# Patient Record
Sex: Female | Born: 1940
Health system: Southern US, Community
[De-identification: ages and names within clinical notes are randomized; demographics above are authoritative.]

---

## 1962-09-21 HISTORY — PX: TOTAL THYROIDECTOMY: SHX2547

## 2003-11-07 ENCOUNTER — Ambulatory Visit (HOSPITAL_COMMUNITY): Admission: RE | Admit: 2003-11-07 | Discharge: 2003-11-07 | Payer: Self-pay | Admitting: Orthopedic Surgery

## 2005-03-02 ENCOUNTER — Emergency Department (HOSPITAL_COMMUNITY): Admission: EM | Admit: 2005-03-02 | Discharge: 2005-03-02 | Payer: Self-pay | Admitting: Emergency Medicine

## 2006-05-26 ENCOUNTER — Encounter: Admission: RE | Admit: 2006-05-26 | Discharge: 2006-05-26 | Payer: Self-pay | Admitting: Gastroenterology

## 2010-04-16 ENCOUNTER — Inpatient Hospital Stay (HOSPITAL_COMMUNITY): Admission: EM | Admit: 2010-04-16 | Discharge: 2010-04-23 | Payer: Self-pay | Admitting: Emergency Medicine

## 2010-04-18 ENCOUNTER — Encounter (INDEPENDENT_AMBULATORY_CARE_PROVIDER_SITE_OTHER): Payer: Self-pay | Admitting: Cardiology

## 2010-07-03 ENCOUNTER — Encounter (HOSPITAL_COMMUNITY)
Admission: RE | Admit: 2010-07-03 | Discharge: 2010-09-08 | Payer: Self-pay | Source: Home / Self Care | Attending: Cardiovascular Disease | Admitting: Cardiovascular Disease

## 2010-08-13 ENCOUNTER — Emergency Department (HOSPITAL_COMMUNITY): Admission: EM | Admit: 2010-08-13 | Discharge: 2010-08-14 | Payer: Self-pay | Admitting: Emergency Medicine

## 2010-10-12 ENCOUNTER — Encounter: Payer: Self-pay | Admitting: Gastroenterology

## 2010-12-02 LAB — CBC
Platelets: 197 10*3/uL (ref 150–400)
RBC: 4.02 MIL/uL (ref 3.87–5.11)
RDW: 13.4 % (ref 11.5–15.5)
WBC: 7.9 10*3/uL (ref 4.0–10.5)

## 2010-12-02 LAB — PROTIME-INR
INR: 1.29 (ref 0.00–1.49)
Prothrombin Time: 16.3 seconds — ABNORMAL HIGH (ref 11.6–15.2)

## 2010-12-02 LAB — DIFFERENTIAL
Basophils Absolute: 0 10*3/uL (ref 0.0–0.1)
Lymphocytes Relative: 30 % (ref 12–46)
Neutro Abs: 4.6 10*3/uL (ref 1.7–7.7)

## 2010-12-02 LAB — APTT: aPTT: 29 seconds (ref 24–37)

## 2010-12-02 LAB — POCT I-STAT, CHEM 8
HCT: 40 % (ref 36.0–46.0)
Hemoglobin: 13.6 g/dL (ref 12.0–15.0)
Sodium: 141 mEq/L (ref 135–145)
TCO2: 25 mmol/L (ref 0–100)

## 2010-12-05 LAB — BASIC METABOLIC PANEL
CO2: 24 mEq/L (ref 19–32)
Calcium: 8.8 mg/dL (ref 8.4–10.5)
Calcium: 9 mg/dL (ref 8.4–10.5)
Creatinine, Ser: 0.94 mg/dL (ref 0.4–1.2)
GFR calc Af Amer: 56 mL/min — ABNORMAL LOW (ref >60)
GFR calc Af Amer: >60 mL/min (ref >60)
GFR calc non Af Amer: 47 mL/min — ABNORMAL LOW (ref >60)
Glucose, Bld: 109 mg/dL — ABNORMAL HIGH (ref 70–99)
Sodium: 140 mEq/L (ref 135–145)

## 2010-12-05 LAB — CBC
HCT: 40.1 % (ref 36.0–46.0)
Hemoglobin: 13.3 g/dL (ref 12.0–15.0)
Hemoglobin: 13.4 g/dL (ref 12.0–15.0)
MCH: 32.3 pg (ref 26.0–34.0)
MCH: 32.6 pg (ref 26.0–34.0)
MCHC: 33.2 g/dL (ref 30.0–36.0)
MCHC: 34.1 g/dL (ref 30.0–36.0)
MCHC: 34.1 g/dL (ref 30.0–36.0)
MCV: 97.3 fL (ref 78.0–100.0)
Platelets: 205 10*3/uL (ref 150–400)
RBC: 4 MIL/uL (ref 3.87–5.11)
RBC: 4.27 MIL/uL (ref 3.87–5.11)
RDW: 12.7 % (ref 11.5–15.5)

## 2010-12-05 LAB — PROTIME-INR: Prothrombin Time: 20.5 seconds — ABNORMAL HIGH (ref 11.6–15.2)

## 2010-12-05 LAB — HEPARIN LEVEL (UNFRACTIONATED)
Heparin Unfractionated: 0.24 IU/mL — ABNORMAL LOW (ref 0.30–0.70)
Heparin Unfractionated: 0.32 IU/mL (ref 0.30–0.70)

## 2010-12-05 LAB — BRAIN NATRIURETIC PEPTIDE: Pro B Natriuretic peptide (BNP): 981 pg/mL — ABNORMAL HIGH (ref 0.0–100.0)

## 2010-12-06 LAB — COMPREHENSIVE METABOLIC PANEL
ALT: 22 U/L (ref 0–35)
Alkaline Phosphatase: 56 U/L (ref 39–117)
BUN: 9 mg/dL (ref 6–23)
CO2: 27 mEq/L (ref 19–32)
Chloride: 102 mEq/L (ref 96–112)
Glucose, Bld: 93 mg/dL (ref 70–99)
Potassium: 3.9 mEq/L (ref 3.5–5.1)
Sodium: 138 mEq/L (ref 135–145)
Total Bilirubin: 0.5 mg/dL (ref 0.3–1.2)
Total Protein: 6.7 g/dL (ref 6.0–8.3)

## 2010-12-06 LAB — BASIC METABOLIC PANEL
BUN: 10 mg/dL (ref 6–23)
CO2: 23 mEq/L (ref 19–32)
Calcium: 8.4 mg/dL (ref 8.4–10.5)
Calcium: 8.8 mg/dL (ref 8.4–10.5)
Chloride: 104 mEq/L (ref 96–112)
Chloride: 110 mEq/L (ref 96–112)
Creatinine, Ser: 0.89 mg/dL (ref 0.4–1.2)
GFR calc Af Amer: >60 mL/min (ref >60)
GFR calc non Af Amer: 60 mL/min — ABNORMAL LOW (ref >60)
GFR calc non Af Amer: >60 mL/min (ref >60)
GFR calc non Af Amer: >60 mL/min (ref >60)
Glucose, Bld: 109 mg/dL — ABNORMAL HIGH (ref 70–99)
Glucose, Bld: 125 mg/dL — ABNORMAL HIGH (ref 70–99)
Potassium: 3.1 mEq/L — ABNORMAL LOW (ref 3.5–5.1)
Potassium: 3.4 mEq/L — ABNORMAL LOW (ref 3.5–5.1)
Potassium: 3.8 mEq/L (ref 3.5–5.1)
Sodium: 137 mEq/L (ref 135–145)
Sodium: 139 mEq/L (ref 135–145)
Sodium: 141 mEq/L (ref 135–145)

## 2010-12-06 LAB — POCT CARDIAC MARKERS
CKMB, poc: 36.3 ng/mL (ref 1.0–8.0)
CKMB, poc: 72.5 ng/mL (ref 1.0–8.0)
Myoglobin, poc: >500 ng/mL (ref 12–200)
Myoglobin, poc: >500 ng/mL (ref 12–200)
Troponin i, poc: 6.94 ng/mL (ref 0.00–0.09)

## 2010-12-06 LAB — CBC
HCT: 39.7 % (ref 36.0–46.0)
HCT: 39.7 % (ref 36.0–46.0)
Hemoglobin: 13.5 g/dL (ref 12.0–15.0)
Hemoglobin: 13.6 g/dL (ref 12.0–15.0)
MCH: 32.9 pg (ref 26.0–34.0)
MCHC: 34 g/dL (ref 30.0–36.0)
MCHC: 34.1 g/dL (ref 30.0–36.0)
MCHC: 34.3 g/dL (ref 30.0–36.0)
MCV: 99.5 fL (ref 78.0–100.0)
Platelets: 162 10*3/uL (ref 150–400)
Platelets: 174 10*3/uL (ref 150–400)
RBC: 4.03 MIL/uL (ref 3.87–5.11)
RBC: 4.13 MIL/uL (ref 3.87–5.11)
RDW: 12.9 % (ref 11.5–15.5)
RDW: 13 % (ref 11.5–15.5)
WBC: 8.1 10*3/uL (ref 4.0–10.5)
WBC: 8.9 10*3/uL (ref 4.0–10.5)
WBC: 9.9 10*3/uL (ref 4.0–10.5)

## 2010-12-06 LAB — HEPARIN LEVEL (UNFRACTIONATED)
Heparin Unfractionated: 0.18 IU/mL — ABNORMAL LOW (ref 0.30–0.70)
Heparin Unfractionated: 0.5 IU/mL (ref 0.30–0.70)
Heparin Unfractionated: 0.68 IU/mL (ref 0.30–0.70)

## 2010-12-06 LAB — CARDIAC PANEL(CRET KIN+CKTOT+MB+TROPI)
CK, MB: 105.9 ng/mL (ref 0.3–4.0)
CK, MB: 183.5 ng/mL (ref 0.3–4.0)
CK, MB: 39.8 ng/mL (ref 0.3–4.0)
CK, MB: 66.9 ng/mL (ref 0.3–4.0)
Relative Index: 15.3 — ABNORMAL HIGH (ref 0.0–2.5)
Relative Index: 16.2 — ABNORMAL HIGH (ref 0.0–2.5)
Relative Index: 18.9 — ABNORMAL HIGH (ref 0.0–2.5)
Troponin I: 12.27 ng/mL (ref 0.00–0.06)
Troponin I: 18.54 ng/mL (ref 0.00–0.06)
Troponin I: 29.82 ng/mL (ref 0.00–0.06)

## 2010-12-06 LAB — PROTIME-INR
INR: 1.05 (ref 0.00–1.49)
Prothrombin Time: 13.6 seconds (ref 11.6–15.2)

## 2010-12-06 LAB — BRAIN NATRIURETIC PEPTIDE
Pro B Natriuretic peptide (BNP): 1117 pg/mL — ABNORMAL HIGH (ref 0.0–100.0)
Pro B Natriuretic peptide (BNP): 117 pg/mL — ABNORMAL HIGH (ref 0.0–100.0)
Pro B Natriuretic peptide (BNP): 884 pg/mL — ABNORMAL HIGH (ref 0.0–100.0)

## 2010-12-06 LAB — DIFFERENTIAL
Basophils Relative: 1 % (ref 0–1)
Lymphocytes Relative: 26 % (ref 12–46)
Lymphs Abs: 1.8 10*3/uL (ref 0.7–4.0)
Monocytes Absolute: 0.7 10*3/uL (ref 0.1–1.0)
Monocytes Relative: 11 % (ref 3–12)
Neutro Abs: 4.3 10*3/uL (ref 1.7–7.7)
Neutrophils Relative %: 63 % (ref 43–77)

## 2010-12-06 LAB — LIPID PANEL
LDL Cholesterol: 151 mg/dL — ABNORMAL HIGH (ref 0–99)
Total CHOL/HDL Ratio: 5 RATIO
Triglycerides: 107 mg/dL (ref <150)
VLDL: 21 mg/dL (ref 0–40)

## 2010-12-06 LAB — HEMOGLOBIN A1C
Hgb A1c MFr Bld: 5.6 % (ref <5.7)
Mean Plasma Glucose: 114 mg/dL (ref <117)

## 2010-12-06 LAB — APTT: aPTT: 30 seconds (ref 24–37)

## 2010-12-06 LAB — MAGNESIUM: Magnesium: 2.1 mg/dL (ref 1.5–2.5)

## 2010-12-06 LAB — MRSA PCR SCREENING: MRSA by PCR: NEGATIVE

## 2010-12-18 ENCOUNTER — Encounter (INDEPENDENT_AMBULATORY_CARE_PROVIDER_SITE_OTHER): Payer: Medicare Other

## 2010-12-18 DIAGNOSIS — I059 Rheumatic mitral valve disease, unspecified: Secondary | ICD-10-CM

## 2010-12-18 NOTE — Consult Note (Signed)
[facility] PATIENT CONSULTATION  Chelsea Warren, MCNEW DOB:  03-31-1941                                        December 18, 2010 CHART #:  [id number]  REQUESTING PHYSICIAN:  Sandy Salaam, MD  REASON FOR CONSULTATION:  Mitral regurgitation and atrial fibrillation.  HISTORY OF PRESENT ILLNESS:  The patient is a 70 year old widowed white female from [place] with history of mitral regurgitation, atrial fibrillation, coronary artery disease, hypertension, hyperlipidemia, and obesity.  The patient was in her usual state of health until July 2011 when she suffered an acute non-ST-segment elevation myocardial infarction associated with new-onset persistent atrial fibrillation.  At the time, she was hospitalized at [facility].  She underwent cardiac catheterization by Dr. Daphene Jaeger, demonstrating single vessel coronary artery disease with 100% occlusion of the terminal distal portion of an obtuse marginal branch of the left circumflex coronary artery.  There was normal left ventricular function.  The area of high-grade stenosis and occlusion was so far distal in the vessel, revascularization was felt not to be indicated, and the patient was treated medically.  Echocardiogram performed at the time demonstrated mitral regurgitation, although initial report of echocardiogram did not comment upon this.  There was severe left atrial enlargement.  The patient was discharged from the hospital on amiodarone and Coumadin with intention for close followup and possible late DC cardioversion if she did not spontaneously convert to sinus rhythm on her own.  She was followed for a period of time through Dr. Landry Dyke [facility] in [place], but the patient continued to do poorly with symptoms of severe shortness of breath, fatigue and chest pain.  She continued to return to Dr. [facility] but was never able to get any symptomatic relief and she was told this was due to her  obesity.  Ultimately she chose to transfer her subsequent medical care to [facility] in [facility].  Since then she has been followed by Dr. Rudolpho Sevin.  She underwent nuclear stress test on October 14, 2010.  She was noted to have poor exercise capacity.  Baseline left ventricular ejection fraction was estimated 61%.  There was a fixed defect noted in the inferior lateral wall but there is no sign of any stress-induced ischemia.  She continued on medical therapy.  She was hospitalized at [facility] in February with nosebleed and exacerbation of symptoms of congestive heart failure associated with atrial fibrillation with poor heart rate control.  Followup echocardiogram performed at that time demonstrated moderate-to-severe mitral regurgitation with normal left ventricular systolic function.  The patient's medications were adjusted and she symptomatically improved.  She has been seen in follow up by Dr. Rudolpho Sevin since then and discussions have been entertained regarding the possibility of elective mitral valve repair and Maze procedure.  She is referred for surgical consultation with plans to proceed with transesophageal echocardiogram and cardiac catheterization in the future if the patient is interested in proceeding with further diagnostic evaluation to consider elective surgical therapy.  REVIEW OF SYSTEMS:  GENERAL:  The patient reports normal appetite.  She has in fact gained 10-15 pounds weight over the last year or so, which she attributes to decrease physical activity.  She is 5 feet 5 inches tall and weighs approximately 220 pounds. CARDIAC:  The patient has stable symptoms of exertional shortness of breath.  She denies resting shortness of breath and PND.  She has orthopnea.  She has had some episodes of chest tightness in the past but none recently.  She states that for the last month or so she has been doing better on medical therapy  then she had been for month prior to that, although she does get short of breath with strenuous activity. She has had palpitations.  She has never had syncopal episodes.  She has not been having lower extremity edema.  RESPIRATORY:  Negative.  The patient denies productive cough, hemoptysis, or wheezing. GASTROINTESTINAL:  Notable for symptoms of reflux that are usually easily controlled with medical therapy.  The patient has no difficulty swallowing.  She has occasional problems with constipation.  She denies hematochezia, hematemesis, or melena.  GENITOURINARY:  Negative. PERIPHERAL VASCULAR:  Negative. NEUROLOGIC:  Negative. MUSCULOSKELETAL:  Negative.  The patient has mild arthralgias in her left knee but otherwise she is remarkably free of any problems with arthritis or arthralgias. PSYCHIATRIC:  Notable for long history of anxiety and nervousness. HEENT:  Negative.  The patient does have partial lower plate dentures with a loose wire and she has not seen a dentist in more than 2 years. She denies any loose teeth or teeth which are painful. HEMATOLOGIC:  Negative.  The patient does have occasional episodes of epistaxis and she has been on Coumadin therapy but overall she has done fairly well without any significant complications.  PAST MEDICAL HISTORY: 1. Mitral regurgitation. 2. Persistent atrial fibrillation. 3. Congestive heart failure. 4. Coronary artery disease, status post acute non-ST-segment elevation     myocardial infarction in July 2011. 5. Hypertension. 6. Hyperlipidemia. 7. Obesity. 8. Hypothyroidism. 9. GE reflux disease. 10.Remote tobacco use.  PAST SURGICAL HISTORY: 1. Subtotal thyroidectomy for goiter. 2. Hysterectomy. 3. Tonsillectomy. 4. Left knee surgery.  FAMILY HISTORY:  Noncontributory.  SOCIAL HISTORY:  The patient is widowed and lives alone in [place]. She has five grown children and four grandchildren.  She is retired but remains active  physically and currently tends to numerous rental properties.  She drives an automobile and tends to all of her own personal needs independently.  The patient has a history of tobacco use in the past but quit smoking completely in the summer of 2011 after her heart attack.  Prior to that she only smoked occasionally.  The patient denies alcohol consumption.  CURRENT MEDICATIONS: 1. Coumadin 5 mg daily 3 times a week, 2.5 mg daily 4 times a week. 2. Levoxyl 137 mcg daily. 3. Micardis 40 mg daily. 4. Diltiazem extended release 120 mg twice daily. 5. Aspirin 81 mg daily. 6. Prevacid 15 mg daily as needed. 7. Xanax 0.5 mg 3 times daily as needed. 8. Crestor 20 mg daily.  DRUG ALLERGIES:  The patient believes that she may be allergic to something given to her at the dentist office and she is not sure what it was.  The patient also states that she has been having hives recently and she suspects that may be related to Crestor, but this is not certain and she is still taking Crestor.  PHYSICAL EXAMINATION:  GENERAL:  The patient is a well-appearing obese female who appears in her stated age in no acute distress.  VITAL SIGNS: Pulses 90 and irregular.  HEENT:  Unrevealing.  There is no palpable lymphadenopathy.  There are no carotid bruits.  LUNGS:  Auscultation of the chest reveals clear breath sounds which are symmetrical bilaterally. No wheezes, rales, or rhonchi  noted.  CARDIOVASCULAR:  Notable for distant heart sounds with irregular rhythm.  I cannot appreciate a murmur on exam.  ABDOMEN:  Obese but soft, nontender.  There are no palpable masses.  Bowel sounds are present.  EXTREMITIES:  Warm and well perfused.  There is no lower extremity edema.  Distal pulses are not palpable.  Femoral pulses were difficult to palpate due to the patient's body habitus.  RECTAL and GU:  Both deferred.  SKIN:  Clean, dry, healthy-appearing throughout.  DIAGNOSTIC TEST:  Cardiac catheterization  performed on April 17, 2010 at [facility], is reviewed.  This demonstrates codominant coronary circulation.  There is 50% ostial stenosis of the first obtuse marginal branch of the left circumflex coronary artery.  There is 100% distal occlusion of a large third obtuse marginal branch.  The location of the occlusion is extremely distal on this vessel towards the apex of the heart.  The left anterior descending coronary artery is completely free of significant coronary artery disease.  The right coronary artery and its branches are free of significant coronary artery disease.  No other significant flow-limiting lesions were noted.  Aortogram of the infrarenal abdominal aorta and iliac vessels is notable for the absence of any significant aortoiliac occlusive disease.  Left ventricular function appears normal.  There appeared to be mild mitral regurgitation at cath.  A 2-D echocardiogram performed on April 18, 2010 at [facility], is reviewed.  This demonstrates mild left ventricular dysfunction with inferior wall hypokinesis.  Ejection fraction is estimated 50-55%.  There is mild aortic insufficiency.  There is moderate (2+) mitral regurgitation.  There is severe left atrial enlargement.  The mitral regurgitation was not comment upon in the official report of this exam.  A 2-D echocardiogram performed on November 14, 2010 at [facility], is reviewed.  This demonstrates mild left ventricular dysfunction with ejection fraction estimated 50% and inferior wall hypokinesis.  There is moderate-to-severe mitral regurgitation.  The functional anatomy of the mitral valve is suggestive of type 1 disease with a broad central jet of regurgitation although the leaflets of the valve are not well visualized in this transthoracic study.  There is mild aortic insufficiency.  There is mild tricuspid regurgitation.  There is severe left atrial  enlargement.  There is mild- to-moderate left ventricular hypertrophy.  IMPRESSION:  Mitral regurgitation with persistent atrial fibrillation and presently stable symptoms of exertional shortness of breath.  The most recent 2-D echocardiogram is notable for substantially increased severity of mitral regurgitation in comparison with echocardiogram performed in July 2011.  This may be related to loading conditions and changes in hemodynamics, as I suspect that the functional anatomy of the mitral valve is consistent with likely purely functional regurgitation. Under the circumstances in a female patient with hypertension and atrial fibrillation associated with severe left atrial enlargement and pure mitral annular dilatation, I do think that she would benefit from elective mitral valve repair and Maze procedure.  Alternatives include proceeding with surgical intervention versus continued medical therapy.  PLAN:  I have discussed matters at length with the patient and her family here in the office today.  We discussed the role of continued medical therapy with close followup versus proceeding with further evaluation to consider elective surgical intervention.  We discussed a variety of surgical approaches and in particular, the patient specifically interested in the minithoracotomy approach for mitral valve repair and Maze procedure.  This would depend upon the absence of  significant coronary artery disease and need for surgical revascularization, but based upon the catheterization performed last July I suspect that this would be reasonable.  Repeat catheterization will still be necessary prior to making any final decisions. Transesophageal echocardiogram would also be helpful to reassess the functional anatomy and severity of mitral regurgitation, as the likelihood of repairing the valve is particularly important in making this decision.  All their questions have been addressed.  The  patient plans to contact Dr. Jerrell Myl[facility] to schedule transesophageal echocardiogram and left and right heart catheterization in the near future.  Once this has been completed, we can reevaluate and consider proceeding with surgery depending upon findings.  All their questions have been addressed.  Salvatore Decent. Cornelius Moras, M.D. Electronically Signed  CHO/MEDQ  D:  12/18/2010  T:  12/18/2010  Job:  [id number]  cc:   Lucianne Lei, MD Sandy Salaam, MD

## 2011-01-26 ENCOUNTER — Encounter (INDEPENDENT_AMBULATORY_CARE_PROVIDER_SITE_OTHER): Payer: Medicare Other | Admitting: Thoracic Surgery (Cardiothoracic Vascular Surgery)

## 2011-01-26 DIAGNOSIS — I4891 Unspecified atrial fibrillation: Secondary | ICD-10-CM

## 2011-01-26 DIAGNOSIS — I059 Rheumatic mitral valve disease, unspecified: Secondary | ICD-10-CM

## 2011-01-27 NOTE — Assessment & Plan Note (Signed)
OFFICE VISIT  Chelsea Warren, Chelsea Warren DOB:  Jan 23, 1941                                        Jan 26, 2011 CHART #:  [id number]  The patient returns to the office today for further followup of mitral regurgitation and atrial fibrillation.  She was originally seen in consultation on December 18, 2010, and a full consultation note was dictated at that time.  Since then she underwent transesophageal echocardiogram as well as left and right heart catheterization by Dr. Tollie Pizza at [facility].  Transesophageal echocardiogram confirmed the presence of moderate-to-severe mitral regurgitation.  Functional anatomy of the mitral valve is consistent with type 1 dysfunction due to annular dilatation with normal leaflet mobility and incomplete coaptation.  There is no sign of mitral valve prolapse.  There is no sign of significant functional restriction. There is decreased left ventricular function with ejection fraction estimated only 35%.  The left ventricle was moderately dilated.  There was mild tricuspid regurgitation and mild aortic insufficiency.  No other abnormalities were noted.  Left and right heart catheterization performed by Dr. Reuel Boom demonstrates the presence of normal coronary artery anatomy with no significant coronary artery disease.  Aortogram of the abdominal aorta with iliac vessels is also notable for the absence of any significant aortoiliac disease.  There is moderately decreased left ventricular function with ejection fraction estimated at 35%.  The right heart catheterization data were not provided.  I discussed matters at length with the patient and one of her daughters here in the office today.  She has decided to change her primary cardiologist to Dr. Reuel Boom and she plans to see him back within the next few weeks.  She is hopeful that changing her blood pressure medications will make her feel better.  She has significant  exertional shortness of breath, currently functional class III.  She remains in atrial fibrillation at this time.  Overall, I suspect that she would benefit from elective mitral valve repair and maze procedure.  Based upon her recent catheterization, I suspect that this could be performed using a minimally invasive approach to further expedite her recovery.  However, her recovery will certainly be limited somewhat due to her obesity and other medical problems as well as her limited physical mobility. Nonetheless, I think she is a reasonable candidate for surgery. Continued medical therapy is certainly a reasonable alternative.  I have cautioned her that she might do well with medical therapy, but on the other hand it is quite possible that left ventricular function will only further deteriorate further increasing the risks and in the future.  All of their questions have been addressed.  She does not wish to consider proceeding with surgery electively at this time but she will plan on talking this over further with Dr. Reuel Boom.  We will plan to see her back in 1 month's time after she has had a chance to discuss matters at length with Dr. Reuel Boom.  Salvatore Decent. Cornelius Moras, M.D. Electronically Signed  CHO/MEDQ  D:  01/26/2011  T:  01/27/2011  Job:  [id number]  cc:   Lanell Matar, DO Sandy Salaam, MD Lucianne Lei, MD

## 2011-02-06 NOTE — Op Note (Signed)
NAME:  Chelsea Warren, Chelsea Warren                         ACCOUNT NO.:  [id number]   MEDICAL RECORD NO.:  [id number]                   PATIENT TYPE:  AMB   LOCATION:  DAY                                  FACILITY:  [facility]   PHYSICIAN:  Georges Lynch. Gioffre, M.D.             DATE OF BIRTH:  01-13-1941   DATE OF PROCEDURE:  11/07/2003  DATE OF DISCHARGE:                                 OPERATIVE REPORT   PREOPERATIVE DIAGNOSES:  1. Complex tear of the anterior cruciate ligament, left knee.  2. Torn posterior horn lateral meniscus, left knee.  3. Tear of the posterior horn of the medial meniscus, left knee.   POSTOPERATIVE DIAGNOSES:  1. Complex tear of the anterior cruciate ligament, left knee.  2. Torn posterior horn lateral meniscus, left knee.  3. Tear of the posterior horn of the medial meniscus, left knee.   OPERATION:  1. Diagnostic arthroscopy, left knee.  2. Medial meniscectomy, left knee.  3. Lateral meniscectomy, left knee.  4. Partial debridement of the anterior cruciate ligament, left knee.   SURGEON:  Georges Lynch. Darrelyn Hillock, M.D.   ASSISTANT:  Nurse.   DESCRIPTION OF PROCEDURE:  Under local anesthesia with standby, routine  orthopedic prep and draping of the left knee was carried out.  A small  punctate incision made in the suprapatellar pouch.  Inflow cannula was  inserted and the knee was distended with saline.  Another small punctate  incision was made in the lateral joint space.  The arthroscope was entered  and a complete diagnostic arthroscopy was carried out.  Of note, she had a  large flap tear of the posterior medial horn of the lateral meniscus.  I  introduced a shaver suction device and did a partial lateral meniscectomy.  Her ACL showed a complex tear, practically a complete tear.  She had some  fibers still present.  I debrided the torn portion of the ACL.  I then went  over to the medial joint space.  She had a large meniscal tear, posterior  horn on the left.  I  introduced the shaver suction device and did a partial  medial meniscectomy.  I thoroughly irrigated out the knee.  I made sure  there were no gross loose bodies.  The MRI showed there was a small loose  body posterior, but that was not free in the joint.  I thoroughly irrigated  out the area.  There was no other pathology noted.  I removed all the fluid,  closed all the punctate incisions with 3-0 nylon suture.  I injected 20 mL  of 0.25% Marcaine and epinephrine into the knee joint.  Following this I  dressed the wound with a Neosporin bundle dressing.  The patient left the  operating room in satisfactory condition.  She was given 1 g of IV  Ancef preop.  During the end of the case she was given 30 mg of Toradol  IV.  She will be on postop are Bufferin one twice a day as an anticoagulant for  two weeks.  She will be on [facility] for pain and crutches,  weightbearing as tolerated.                                               Ronald A. Darrelyn Hillock, M.D.    RAG/MEDQ  D:  11/07/2003  T:  11/07/2003  Job:  [id number]

## 2011-03-09 ENCOUNTER — Ambulatory Visit (INDEPENDENT_AMBULATORY_CARE_PROVIDER_SITE_OTHER): Payer: Medicare Other | Admitting: Thoracic Surgery (Cardiothoracic Vascular Surgery)

## 2011-03-09 DIAGNOSIS — I059 Rheumatic mitral valve disease, unspecified: Secondary | ICD-10-CM

## 2011-03-09 DIAGNOSIS — I4891 Unspecified atrial fibrillation: Secondary | ICD-10-CM

## 2011-03-10 NOTE — Assessment & Plan Note (Signed)
OFFICE VISIT  Chelsea Warren, Chelsea Warren DOB:  [birth year]                                        March 09, 2011 CHART #:  [id number]  HISTORY OF PRESENT ILLNESS:  The patient returns for followup of mitral regurgitation and atrial fibrillation.  She was last seen here in the office on Jan 26, 2011.  Since then, she has been seen in followup by Dr. Tollie Pizza and she underwent DC cardioversion 3 weeks ago.  Following this, she was hospitalized for several days for congestive heart failure, but ultimately she did better.  She says that since then she has been feeling much better and her breathing has significantly improved.  She has been taken off of amiodarone and she is now taking sotalol.  She seems to be maintaining sinus rhythm.  She states that she is breathing better than she has in more than a year.  The remainder of her review of systems is unremarkable.  The remainder of her past medical history is unchanged.  PHYSICAL EXAMINATION:  General:  Notable for a well-appearing female with blood pressure 110/80, pulse 60 regular, oxygen saturation 97% on room air.  Chest:  Auscultation of the chest demonstrates clear breath sounds that are symmetrical bilaterally.  No wheezes, rales or rhonchi are noted.  Cardiovascular:  Notable for regular rate and rhythm.  I do not appreciate a murmur on physical exam.  Extremities:  Warm and well perfused.  There is no lower extremity edema.  IMPRESSION:  The patient is clinically doing better, following recent direct current cardioversion and she appears to be maintaining sinus rhythm, so far on sotalol.  PLAN:  I spent in excess of 30 minutes discussing matters with the patient and her family.  I think it remains quite reasonable to continue another attempt with medical therapy.  If she again develops recurrent persistent atrial fibrillation, I suspect that she will not tolerate it well, and at that point we could consider  elective mitral valve repair and maze procedure.  However, as long as she does well on her current medical regimen, there is no reason to push further with surgery at this time.  We will plan to see her back in one year's time to see how she is getting along.  Salvatore Decent. Cornelius Moras, M.D. Electronically Signed  CHO/MEDQ  D:  03/09/2011  T:  03/10/2011  Job:  [id number]  cc:   Lanell Matar, DO Lucianne Lei, MD

## 2011-09-24 DIAGNOSIS — Z7901 Long term (current) use of anticoagulants: Secondary | ICD-10-CM | POA: Diagnosis not present

## 2011-09-29 DIAGNOSIS — R791 Abnormal coagulation profile: Secondary | ICD-10-CM | POA: Diagnosis not present

## 2011-09-29 DIAGNOSIS — I10 Essential (primary) hypertension: Secondary | ICD-10-CM | POA: Diagnosis not present

## 2011-09-29 DIAGNOSIS — I4891 Unspecified atrial fibrillation: Secondary | ICD-10-CM | POA: Diagnosis not present

## 2011-09-29 DIAGNOSIS — E039 Hypothyroidism, unspecified: Secondary | ICD-10-CM | POA: Diagnosis not present

## 2011-09-29 DIAGNOSIS — Z79899 Other long term (current) drug therapy: Secondary | ICD-10-CM | POA: Diagnosis not present

## 2011-09-29 DIAGNOSIS — E782 Mixed hyperlipidemia: Secondary | ICD-10-CM | POA: Diagnosis not present

## 2011-10-06 DIAGNOSIS — R791 Abnormal coagulation profile: Secondary | ICD-10-CM | POA: Diagnosis not present

## 2011-10-15 DIAGNOSIS — I4891 Unspecified atrial fibrillation: Secondary | ICD-10-CM | POA: Diagnosis not present

## 2011-10-15 DIAGNOSIS — E785 Hyperlipidemia, unspecified: Secondary | ICD-10-CM | POA: Diagnosis not present

## 2011-10-15 DIAGNOSIS — I2510 Atherosclerotic heart disease of native coronary artery without angina pectoris: Secondary | ICD-10-CM | POA: Diagnosis not present

## 2011-10-15 DIAGNOSIS — I5022 Chronic systolic (congestive) heart failure: Secondary | ICD-10-CM | POA: Diagnosis not present

## 2011-10-20 DIAGNOSIS — R791 Abnormal coagulation profile: Secondary | ICD-10-CM | POA: Diagnosis not present

## 2011-10-23 DIAGNOSIS — E039 Hypothyroidism, unspecified: Secondary | ICD-10-CM | POA: Diagnosis not present

## 2011-10-27 DIAGNOSIS — E039 Hypothyroidism, unspecified: Secondary | ICD-10-CM | POA: Diagnosis not present

## 2011-10-27 DIAGNOSIS — E7800 Pure hypercholesterolemia, unspecified: Secondary | ICD-10-CM | POA: Diagnosis not present

## 2011-11-04 DIAGNOSIS — E039 Hypothyroidism, unspecified: Secondary | ICD-10-CM | POA: Diagnosis not present

## 2011-11-04 DIAGNOSIS — I10 Essential (primary) hypertension: Secondary | ICD-10-CM | POA: Diagnosis not present

## 2011-11-04 DIAGNOSIS — Z79899 Other long term (current) drug therapy: Secondary | ICD-10-CM | POA: Diagnosis not present

## 2011-11-04 DIAGNOSIS — K219 Gastro-esophageal reflux disease without esophagitis: Secondary | ICD-10-CM | POA: Diagnosis not present

## 2011-11-04 DIAGNOSIS — E782 Mixed hyperlipidemia: Secondary | ICD-10-CM | POA: Diagnosis not present

## 2011-11-04 DIAGNOSIS — Z7901 Long term (current) use of anticoagulants: Secondary | ICD-10-CM | POA: Diagnosis not present

## 2011-11-18 DIAGNOSIS — R791 Abnormal coagulation profile: Secondary | ICD-10-CM | POA: Diagnosis not present

## 2011-11-26 DIAGNOSIS — H25019 Cortical age-related cataract, unspecified eye: Secondary | ICD-10-CM | POA: Diagnosis not present

## 2011-11-26 DIAGNOSIS — H2510 Age-related nuclear cataract, unspecified eye: Secondary | ICD-10-CM | POA: Diagnosis not present

## 2011-12-02 DIAGNOSIS — I4891 Unspecified atrial fibrillation: Secondary | ICD-10-CM | POA: Diagnosis not present

## 2011-12-02 DIAGNOSIS — E785 Hyperlipidemia, unspecified: Secondary | ICD-10-CM | POA: Diagnosis not present

## 2011-12-02 DIAGNOSIS — I10 Essential (primary) hypertension: Secondary | ICD-10-CM | POA: Diagnosis not present

## 2011-12-02 DIAGNOSIS — Z7901 Long term (current) use of anticoagulants: Secondary | ICD-10-CM | POA: Diagnosis not present

## 2011-12-02 DIAGNOSIS — I2510 Atherosclerotic heart disease of native coronary artery without angina pectoris: Secondary | ICD-10-CM | POA: Diagnosis not present

## 2011-12-22 DIAGNOSIS — I2510 Atherosclerotic heart disease of native coronary artery without angina pectoris: Secondary | ICD-10-CM | POA: Diagnosis not present

## 2011-12-22 DIAGNOSIS — I10 Essential (primary) hypertension: Secondary | ICD-10-CM | POA: Diagnosis not present

## 2011-12-22 DIAGNOSIS — I4891 Unspecified atrial fibrillation: Secondary | ICD-10-CM | POA: Diagnosis not present

## 2011-12-22 DIAGNOSIS — I5022 Chronic systolic (congestive) heart failure: Secondary | ICD-10-CM | POA: Diagnosis not present

## 2011-12-22 DIAGNOSIS — Z79899 Other long term (current) drug therapy: Secondary | ICD-10-CM | POA: Diagnosis not present

## 2011-12-22 DIAGNOSIS — I059 Rheumatic mitral valve disease, unspecified: Secondary | ICD-10-CM | POA: Diagnosis not present

## 2011-12-22 DIAGNOSIS — R079 Chest pain, unspecified: Secondary | ICD-10-CM | POA: Diagnosis not present

## 2011-12-22 DIAGNOSIS — R0602 Shortness of breath: Secondary | ICD-10-CM | POA: Diagnosis not present

## 2011-12-22 DIAGNOSIS — I252 Old myocardial infarction: Secondary | ICD-10-CM | POA: Diagnosis not present

## 2011-12-22 DIAGNOSIS — I429 Cardiomyopathy, unspecified: Secondary | ICD-10-CM | POA: Diagnosis not present

## 2011-12-22 DIAGNOSIS — I739 Peripheral vascular disease, unspecified: Secondary | ICD-10-CM | POA: Diagnosis not present

## 2011-12-22 DIAGNOSIS — Z7901 Long term (current) use of anticoagulants: Secondary | ICD-10-CM | POA: Diagnosis not present

## 2011-12-22 DIAGNOSIS — E785 Hyperlipidemia, unspecified: Secondary | ICD-10-CM | POA: Diagnosis not present

## 2011-12-30 DIAGNOSIS — Z7901 Long term (current) use of anticoagulants: Secondary | ICD-10-CM | POA: Diagnosis not present

## 2011-12-31 DIAGNOSIS — I059 Rheumatic mitral valve disease, unspecified: Secondary | ICD-10-CM | POA: Diagnosis not present

## 2012-01-04 DIAGNOSIS — E785 Hyperlipidemia, unspecified: Secondary | ICD-10-CM | POA: Diagnosis not present

## 2012-01-04 DIAGNOSIS — K219 Gastro-esophageal reflux disease without esophagitis: Secondary | ICD-10-CM | POA: Diagnosis not present

## 2012-01-04 DIAGNOSIS — I4891 Unspecified atrial fibrillation: Secondary | ICD-10-CM | POA: Diagnosis not present

## 2012-01-04 DIAGNOSIS — I2510 Atherosclerotic heart disease of native coronary artery without angina pectoris: Secondary | ICD-10-CM | POA: Diagnosis not present

## 2012-01-04 DIAGNOSIS — E039 Hypothyroidism, unspecified: Secondary | ICD-10-CM | POA: Diagnosis not present

## 2012-01-04 DIAGNOSIS — I10 Essential (primary) hypertension: Secondary | ICD-10-CM | POA: Diagnosis not present

## 2012-01-04 DIAGNOSIS — M159 Polyosteoarthritis, unspecified: Secondary | ICD-10-CM | POA: Diagnosis not present

## 2012-01-19 DIAGNOSIS — I4891 Unspecified atrial fibrillation: Secondary | ICD-10-CM | POA: Diagnosis not present

## 2012-01-19 DIAGNOSIS — H68009 Unspecified Eustachian salpingitis, unspecified ear: Secondary | ICD-10-CM | POA: Diagnosis not present

## 2012-01-19 DIAGNOSIS — J040 Acute laryngitis: Secondary | ICD-10-CM | POA: Diagnosis not present

## 2012-01-19 DIAGNOSIS — J309 Allergic rhinitis, unspecified: Secondary | ICD-10-CM | POA: Diagnosis not present

## 2012-01-19 DIAGNOSIS — R791 Abnormal coagulation profile: Secondary | ICD-10-CM | POA: Diagnosis not present

## 2012-02-18 DIAGNOSIS — E039 Hypothyroidism, unspecified: Secondary | ICD-10-CM | POA: Diagnosis not present

## 2012-02-18 DIAGNOSIS — K219 Gastro-esophageal reflux disease without esophagitis: Secondary | ICD-10-CM | POA: Diagnosis not present

## 2012-02-18 DIAGNOSIS — I4891 Unspecified atrial fibrillation: Secondary | ICD-10-CM | POA: Diagnosis not present

## 2012-02-18 DIAGNOSIS — Z7901 Long term (current) use of anticoagulants: Secondary | ICD-10-CM | POA: Diagnosis not present

## 2012-02-18 DIAGNOSIS — E782 Mixed hyperlipidemia: Secondary | ICD-10-CM | POA: Diagnosis not present

## 2012-02-25 DIAGNOSIS — N390 Urinary tract infection, site not specified: Secondary | ICD-10-CM | POA: Diagnosis not present

## 2012-02-25 DIAGNOSIS — R791 Abnormal coagulation profile: Secondary | ICD-10-CM | POA: Diagnosis not present

## 2012-03-07 ENCOUNTER — Encounter: Payer: Medicare Other | Admitting: Thoracic Surgery (Cardiothoracic Vascular Surgery)

## 2012-03-12 IMAGING — CR DG CHEST 2V
2 series · 2 of 2 positions shown · non-contrast
Comparison: Chest x-ray of 11/05/2003

CLINICAL DATA: Chest pain, history of arrhythmia, hypertension

CHEST - 2 VIEW

[w chest pa]
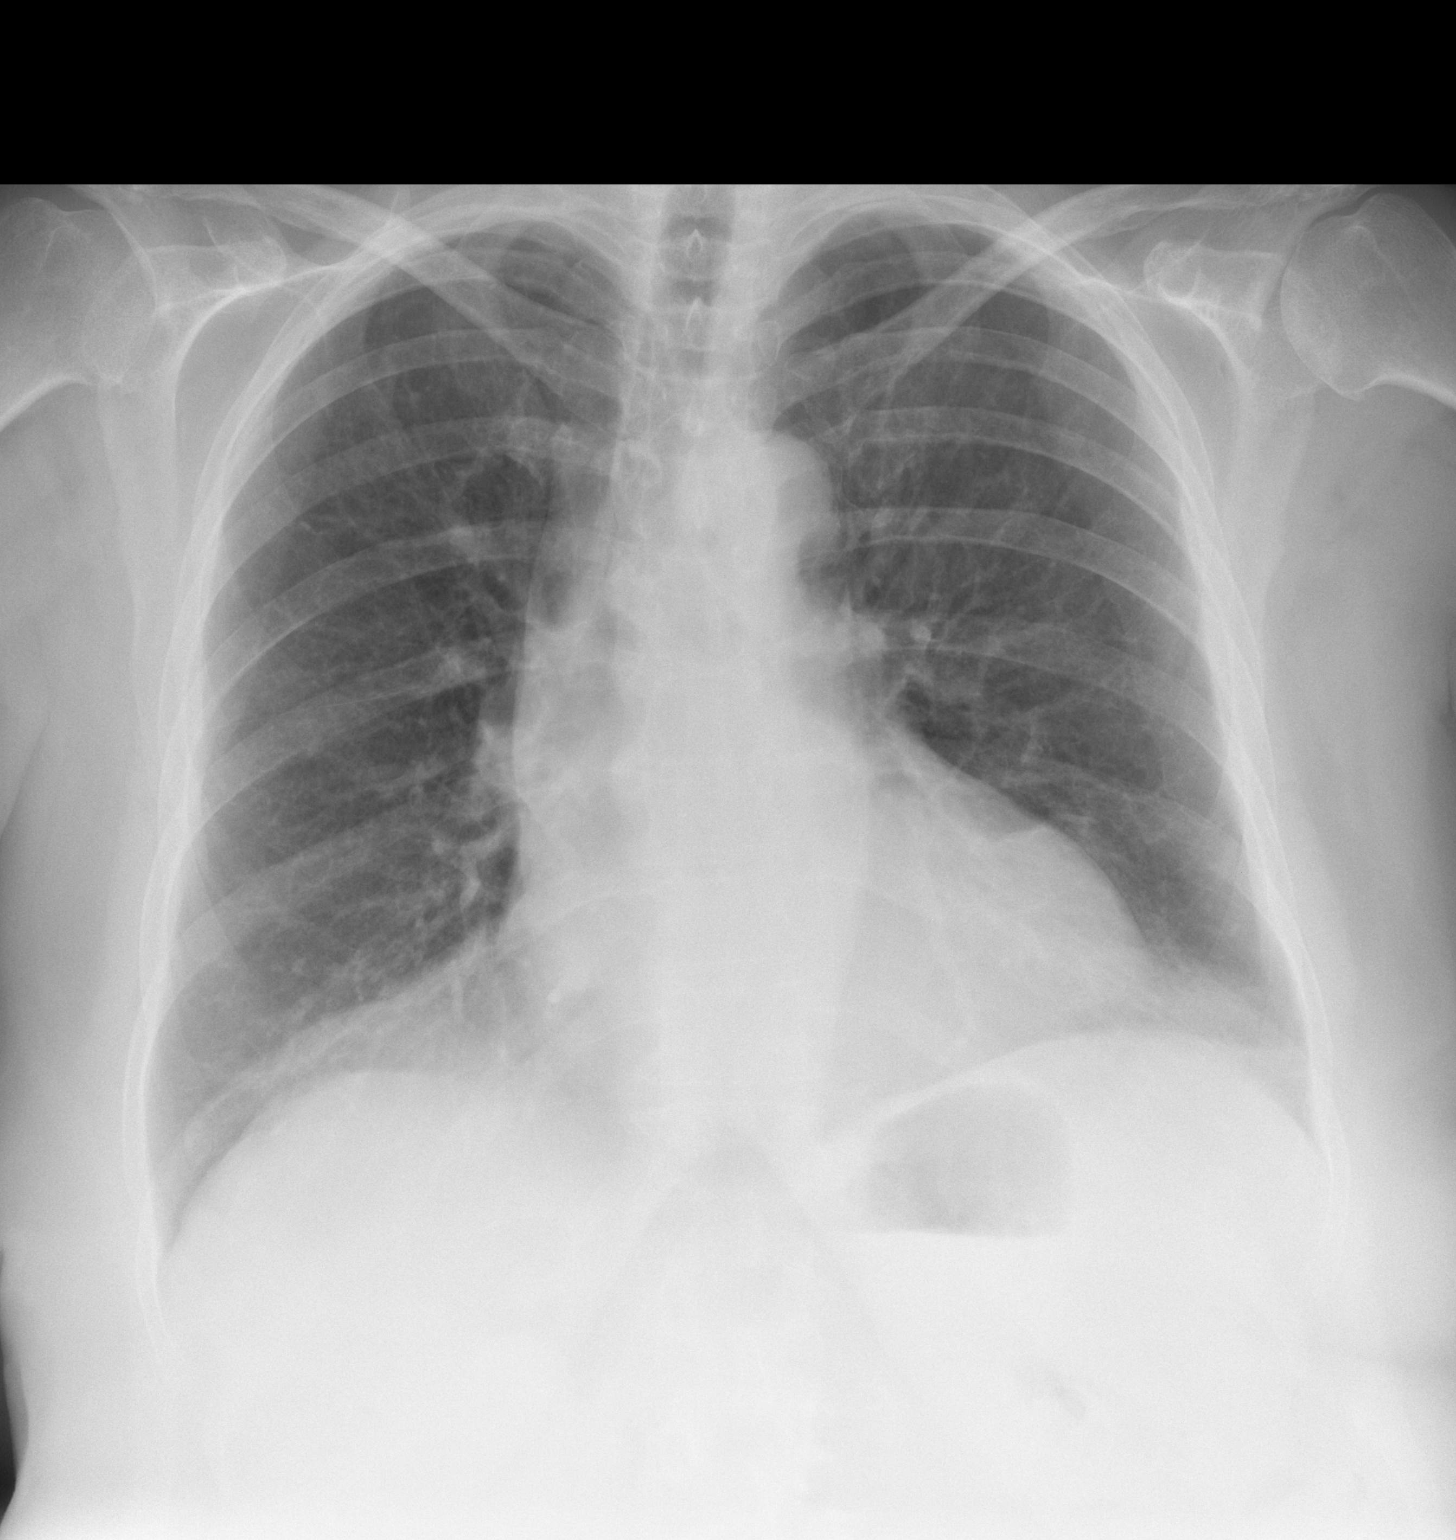

[w chest lat]
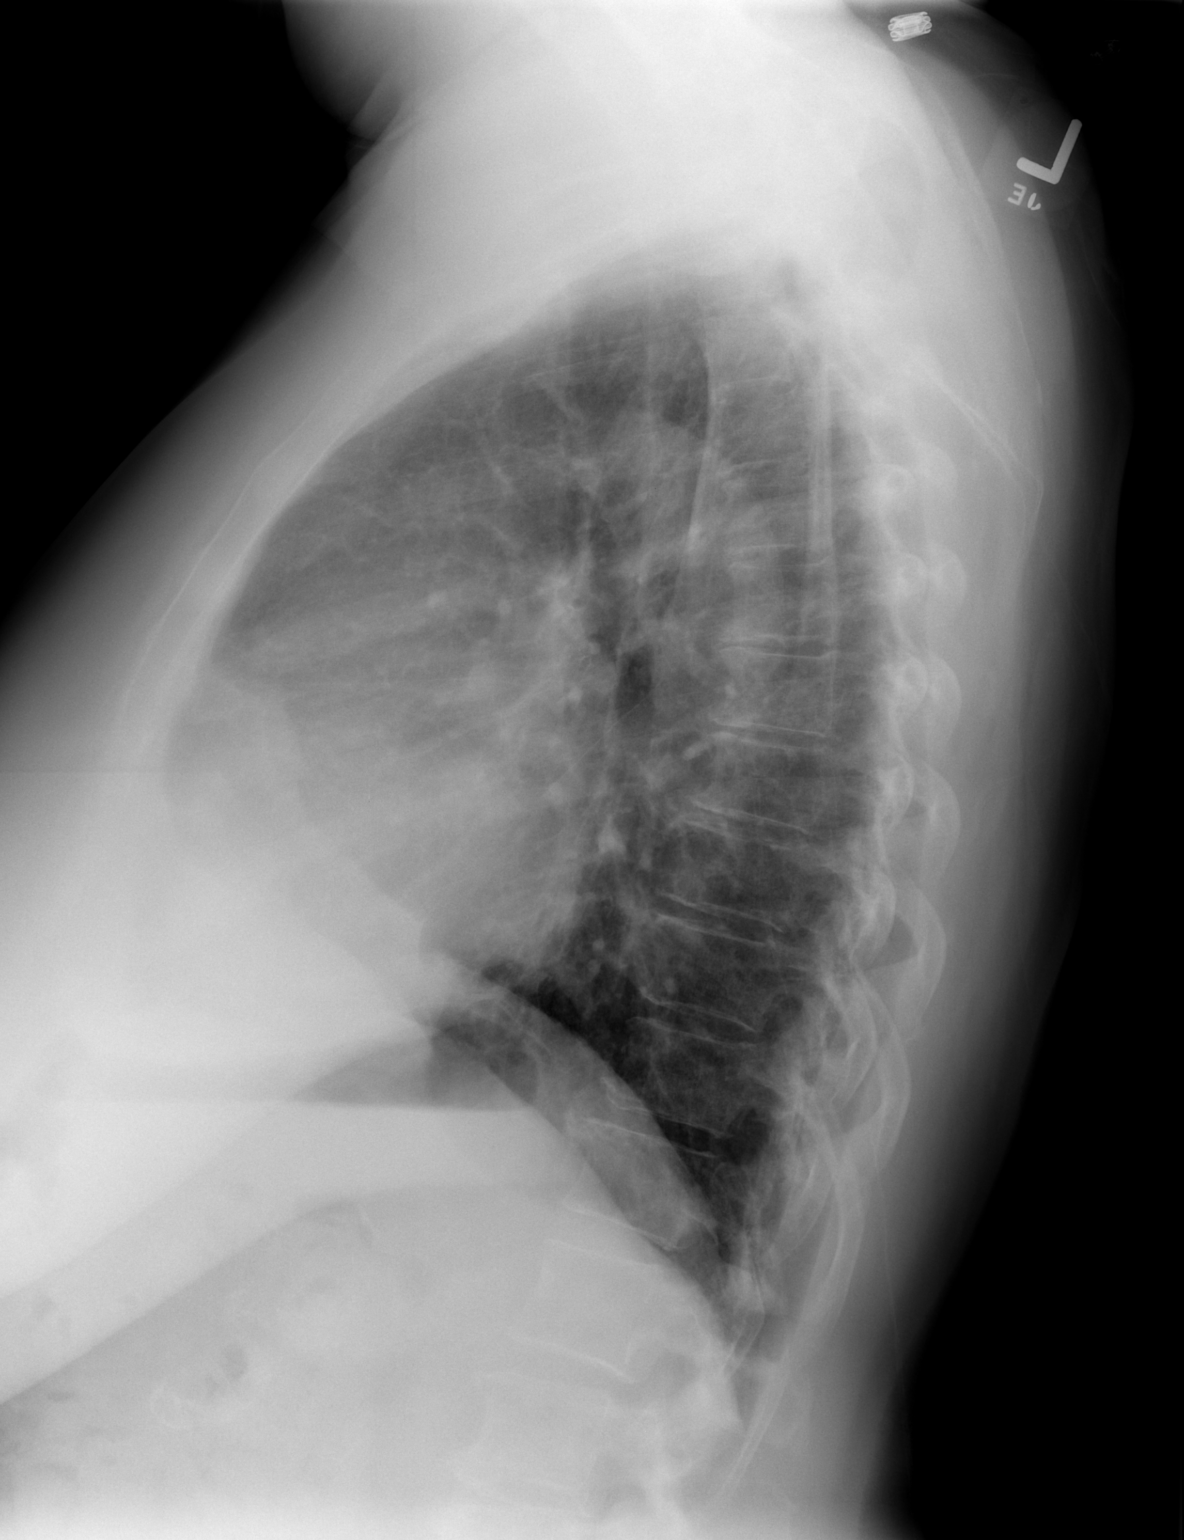

[2 of 2 positions shown; findings below may reference images not displayed]

FINDINGS: No active infiltrate or effusion is seen.  There is some
peribronchial thickening which may indicate bronchitis.  Mild
cardiomegaly is stable.  No acute bony abnormality is seen.
IMPRESSION: No pneumonia.  Peribronchial thickening may indicate bronchitis

## 2012-03-18 IMAGING — CR DG CHEST 2V
2 series · 2 of 2 positions shown · non-contrast
Comparison: April 16, 2010

CLINICAL DATA: Acute coronary syndrome; chest pain

CHEST - 2 VIEW

[w chest pa]
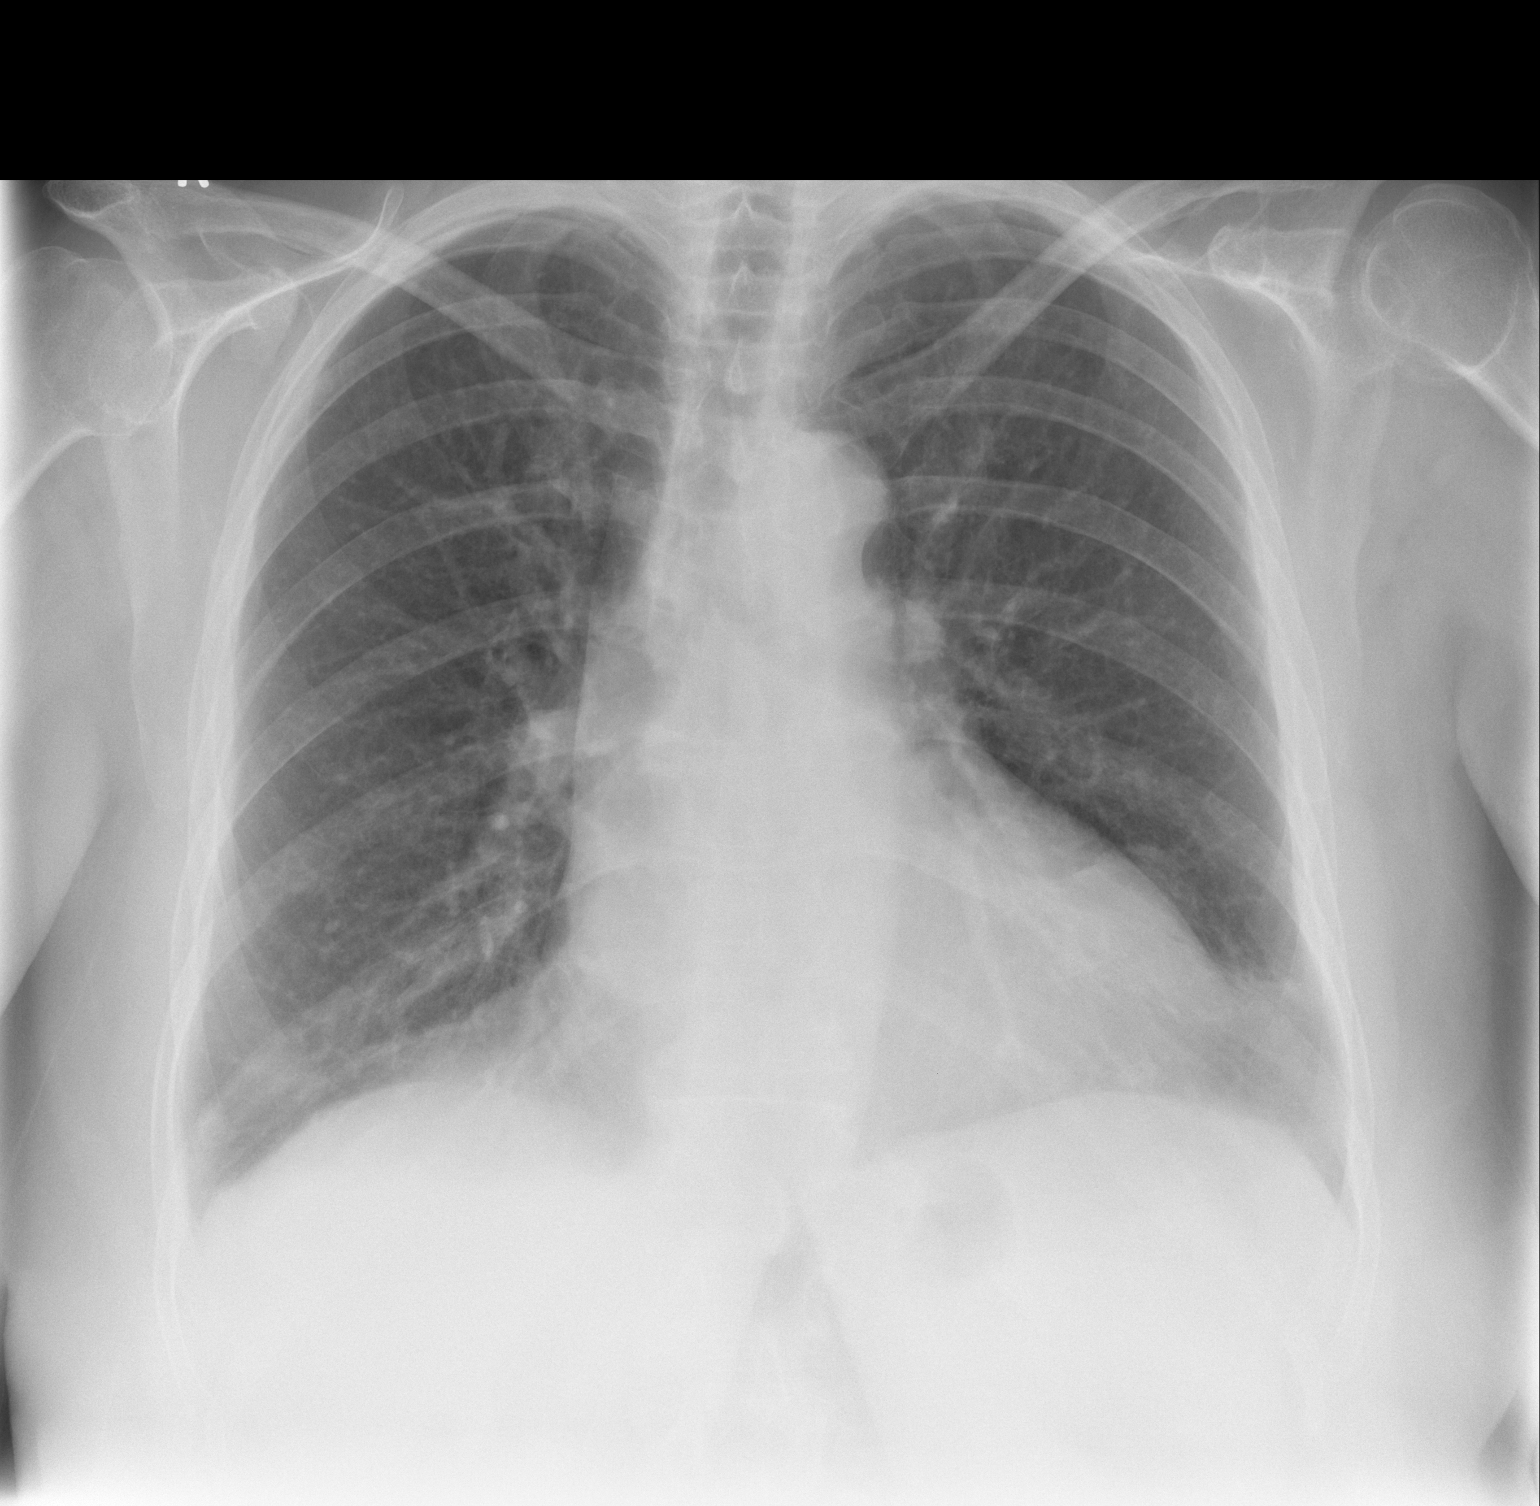

[w chest lat]
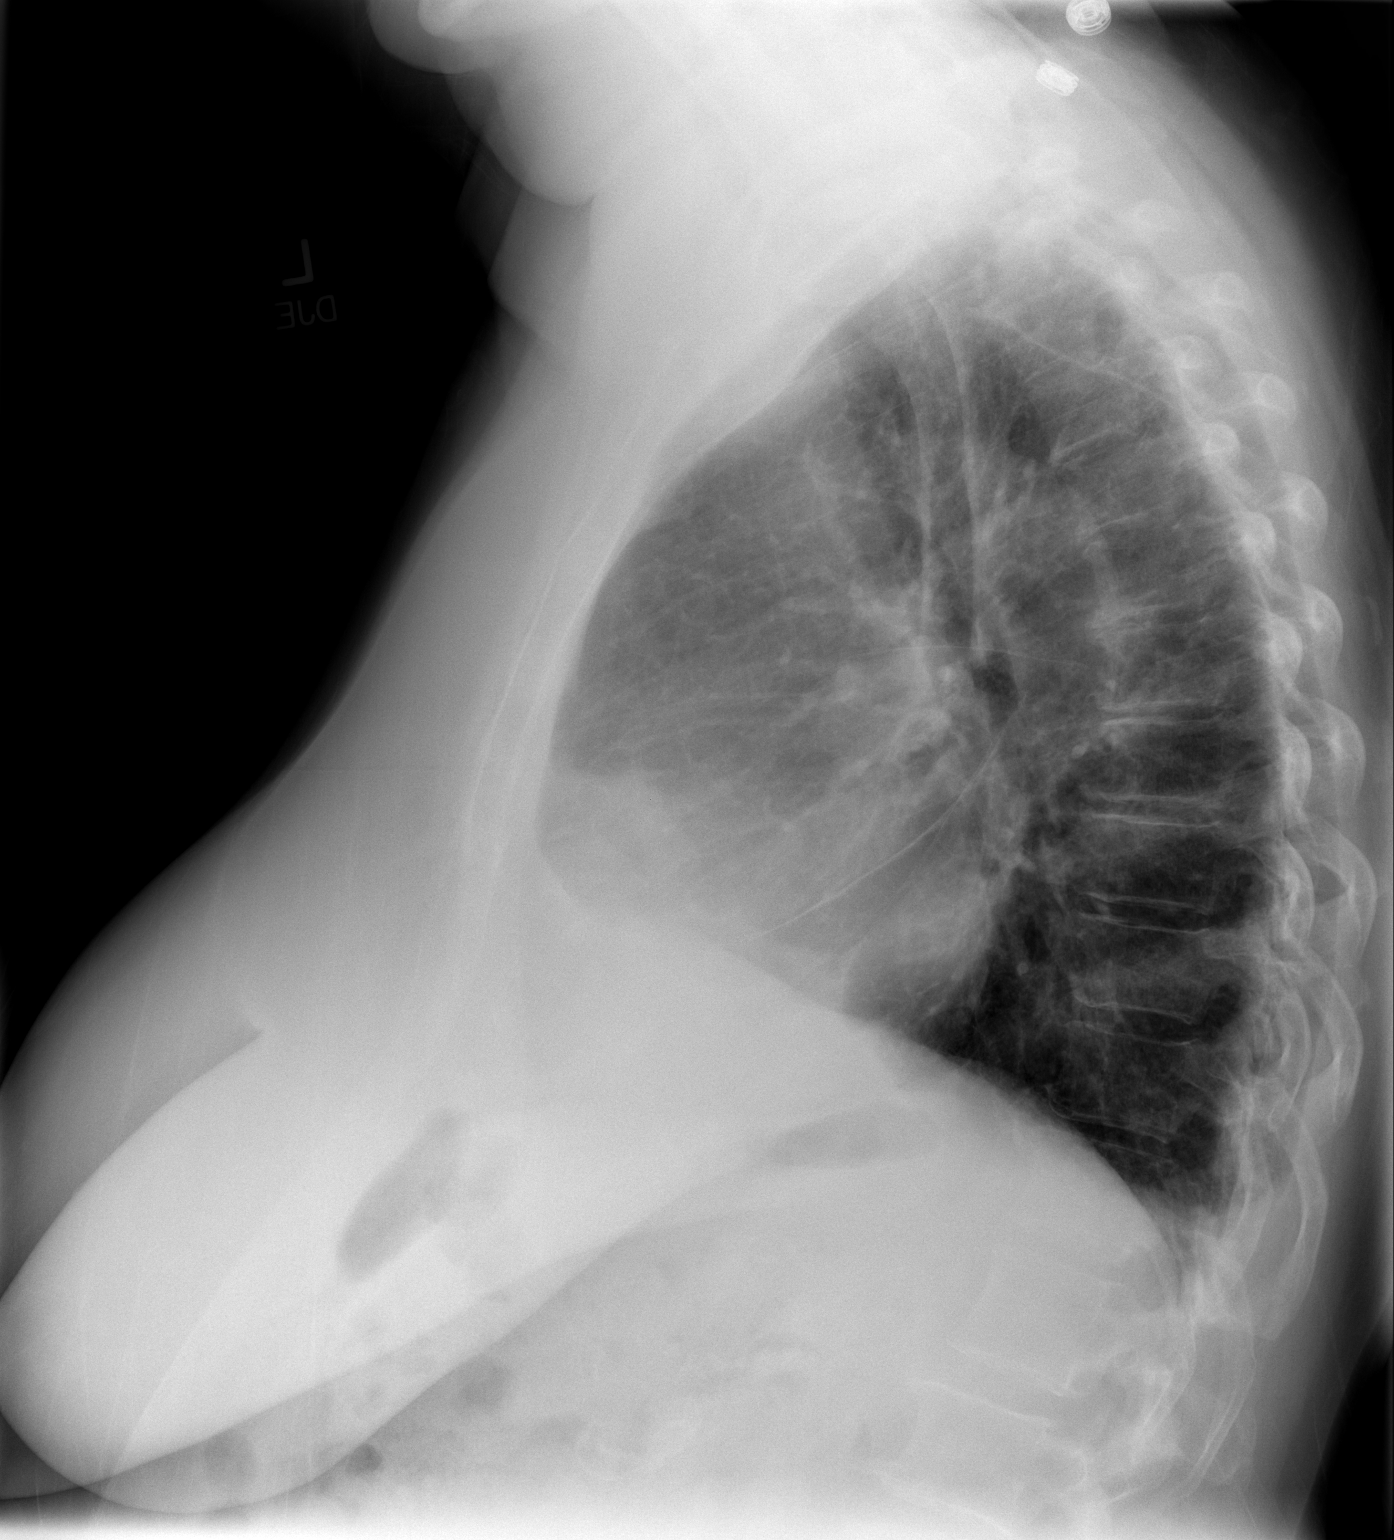

[2 of 2 positions shown; findings below may reference images not displayed]

FINDINGS: The cardiac silhouette, mediastinum, pulmonary
vasculature are within normal limits.  Both lungs are clear.
There is no acute bony abnormality.
IMPRESSION: Stable chest x-ray with no evidence of acute cardiac or pulmonary
process.

## 2012-03-21 DIAGNOSIS — Z0000 Encounter for general adult medical examination without abnormal findings: Secondary | ICD-10-CM | POA: Diagnosis not present

## 2012-03-21 DIAGNOSIS — E559 Vitamin D deficiency, unspecified: Secondary | ICD-10-CM | POA: Diagnosis not present

## 2012-03-21 DIAGNOSIS — I4891 Unspecified atrial fibrillation: Secondary | ICD-10-CM | POA: Diagnosis not present

## 2012-03-21 DIAGNOSIS — E782 Mixed hyperlipidemia: Secondary | ICD-10-CM | POA: Diagnosis not present

## 2012-03-21 DIAGNOSIS — Z79899 Other long term (current) drug therapy: Secondary | ICD-10-CM | POA: Diagnosis not present

## 2012-03-21 DIAGNOSIS — R5381 Other malaise: Secondary | ICD-10-CM | POA: Diagnosis not present

## 2012-03-21 DIAGNOSIS — R791 Abnormal coagulation profile: Secondary | ICD-10-CM | POA: Diagnosis not present

## 2012-03-21 DIAGNOSIS — Z6834 Body mass index (BMI) 34.0-34.9, adult: Secondary | ICD-10-CM | POA: Diagnosis not present

## 2012-03-28 DIAGNOSIS — R791 Abnormal coagulation profile: Secondary | ICD-10-CM | POA: Diagnosis not present

## 2012-03-28 DIAGNOSIS — I4891 Unspecified atrial fibrillation: Secondary | ICD-10-CM | POA: Diagnosis not present

## 2012-03-29 DIAGNOSIS — N201 Calculus of ureter: Secondary | ICD-10-CM | POA: Diagnosis not present

## 2012-03-29 DIAGNOSIS — M545 Low back pain: Secondary | ICD-10-CM | POA: Diagnosis not present

## 2012-03-29 DIAGNOSIS — R109 Unspecified abdominal pain: Secondary | ICD-10-CM | POA: Diagnosis not present

## 2012-03-29 DIAGNOSIS — I2510 Atherosclerotic heart disease of native coronary artery without angina pectoris: Secondary | ICD-10-CM | POA: Diagnosis not present

## 2012-04-01 DIAGNOSIS — Z01812 Encounter for preprocedural laboratory examination: Secondary | ICD-10-CM | POA: Diagnosis not present

## 2012-04-01 DIAGNOSIS — N201 Calculus of ureter: Secondary | ICD-10-CM | POA: Diagnosis not present

## 2012-04-06 DIAGNOSIS — Z7901 Long term (current) use of anticoagulants: Secondary | ICD-10-CM | POA: Diagnosis not present

## 2012-04-06 DIAGNOSIS — E079 Disorder of thyroid, unspecified: Secondary | ICD-10-CM | POA: Diagnosis not present

## 2012-04-06 DIAGNOSIS — I4891 Unspecified atrial fibrillation: Secondary | ICD-10-CM | POA: Diagnosis not present

## 2012-04-06 DIAGNOSIS — I252 Old myocardial infarction: Secondary | ICD-10-CM | POA: Diagnosis not present

## 2012-04-06 DIAGNOSIS — Z79899 Other long term (current) drug therapy: Secondary | ICD-10-CM | POA: Diagnosis not present

## 2012-04-06 DIAGNOSIS — N201 Calculus of ureter: Secondary | ICD-10-CM | POA: Diagnosis not present

## 2012-04-06 DIAGNOSIS — I2510 Atherosclerotic heart disease of native coronary artery without angina pectoris: Secondary | ICD-10-CM | POA: Diagnosis not present

## 2012-04-06 DIAGNOSIS — I10 Essential (primary) hypertension: Secondary | ICD-10-CM | POA: Diagnosis not present

## 2012-04-06 DIAGNOSIS — I059 Rheumatic mitral valve disease, unspecified: Secondary | ICD-10-CM | POA: Diagnosis not present

## 2012-04-07 DIAGNOSIS — N201 Calculus of ureter: Secondary | ICD-10-CM | POA: Diagnosis not present

## 2012-04-07 DIAGNOSIS — I4891 Unspecified atrial fibrillation: Secondary | ICD-10-CM | POA: Diagnosis not present

## 2012-04-07 DIAGNOSIS — I059 Rheumatic mitral valve disease, unspecified: Secondary | ICD-10-CM | POA: Diagnosis not present

## 2012-04-07 DIAGNOSIS — I252 Old myocardial infarction: Secondary | ICD-10-CM | POA: Diagnosis not present

## 2012-04-07 DIAGNOSIS — I2510 Atherosclerotic heart disease of native coronary artery without angina pectoris: Secondary | ICD-10-CM | POA: Diagnosis not present

## 2012-04-07 DIAGNOSIS — I10 Essential (primary) hypertension: Secondary | ICD-10-CM | POA: Diagnosis not present

## 2012-04-11 DIAGNOSIS — I4891 Unspecified atrial fibrillation: Secondary | ICD-10-CM | POA: Diagnosis not present

## 2012-04-11 DIAGNOSIS — K219 Gastro-esophageal reflux disease without esophagitis: Secondary | ICD-10-CM | POA: Diagnosis not present

## 2012-04-11 DIAGNOSIS — I10 Essential (primary) hypertension: Secondary | ICD-10-CM | POA: Diagnosis not present

## 2012-04-11 DIAGNOSIS — R109 Unspecified abdominal pain: Secondary | ICD-10-CM | POA: Diagnosis not present

## 2012-04-11 DIAGNOSIS — R791 Abnormal coagulation profile: Secondary | ICD-10-CM | POA: Diagnosis not present

## 2012-04-11 DIAGNOSIS — N390 Urinary tract infection, site not specified: Secondary | ICD-10-CM | POA: Diagnosis not present

## 2012-04-11 DIAGNOSIS — M545 Low back pain, unspecified: Secondary | ICD-10-CM | POA: Diagnosis not present

## 2012-04-11 DIAGNOSIS — N201 Calculus of ureter: Secondary | ICD-10-CM | POA: Diagnosis not present

## 2012-04-11 DIAGNOSIS — E559 Vitamin D deficiency, unspecified: Secondary | ICD-10-CM | POA: Diagnosis not present

## 2012-04-19 DIAGNOSIS — N201 Calculus of ureter: Secondary | ICD-10-CM | POA: Diagnosis not present

## 2012-04-19 DIAGNOSIS — R109 Unspecified abdominal pain: Secondary | ICD-10-CM | POA: Diagnosis not present

## 2012-04-19 DIAGNOSIS — R791 Abnormal coagulation profile: Secondary | ICD-10-CM | POA: Diagnosis not present

## 2012-04-19 DIAGNOSIS — N390 Urinary tract infection, site not specified: Secondary | ICD-10-CM | POA: Diagnosis not present

## 2012-04-19 DIAGNOSIS — M545 Low back pain: Secondary | ICD-10-CM | POA: Diagnosis not present

## 2012-04-26 DIAGNOSIS — E039 Hypothyroidism, unspecified: Secondary | ICD-10-CM | POA: Diagnosis not present

## 2012-04-26 DIAGNOSIS — R791 Abnormal coagulation profile: Secondary | ICD-10-CM | POA: Diagnosis not present

## 2012-04-29 DIAGNOSIS — E039 Hypothyroidism, unspecified: Secondary | ICD-10-CM | POA: Diagnosis not present

## 2012-05-05 DIAGNOSIS — I4891 Unspecified atrial fibrillation: Secondary | ICD-10-CM | POA: Diagnosis not present

## 2012-05-05 DIAGNOSIS — R791 Abnormal coagulation profile: Secondary | ICD-10-CM | POA: Diagnosis not present

## 2012-05-06 DIAGNOSIS — R791 Abnormal coagulation profile: Secondary | ICD-10-CM | POA: Diagnosis not present

## 2012-05-13 DIAGNOSIS — R791 Abnormal coagulation profile: Secondary | ICD-10-CM | POA: Diagnosis not present

## 2012-05-20 DIAGNOSIS — R791 Abnormal coagulation profile: Secondary | ICD-10-CM | POA: Diagnosis not present

## 2012-05-26 DIAGNOSIS — R791 Abnormal coagulation profile: Secondary | ICD-10-CM | POA: Diagnosis not present

## 2012-05-26 DIAGNOSIS — B372 Candidiasis of skin and nail: Secondary | ICD-10-CM | POA: Diagnosis not present

## 2012-05-26 DIAGNOSIS — R109 Unspecified abdominal pain: Secondary | ICD-10-CM | POA: Diagnosis not present

## 2012-05-26 DIAGNOSIS — Z6834 Body mass index (BMI) 34.0-34.9, adult: Secondary | ICD-10-CM | POA: Diagnosis not present

## 2012-06-02 DIAGNOSIS — M545 Low back pain: Secondary | ICD-10-CM | POA: Diagnosis not present

## 2012-06-02 DIAGNOSIS — N390 Urinary tract infection, site not specified: Secondary | ICD-10-CM | POA: Diagnosis not present

## 2012-06-02 DIAGNOSIS — R109 Unspecified abdominal pain: Secondary | ICD-10-CM | POA: Diagnosis not present

## 2012-06-02 DIAGNOSIS — I2510 Atherosclerotic heart disease of native coronary artery without angina pectoris: Secondary | ICD-10-CM | POA: Diagnosis not present

## 2012-06-03 DIAGNOSIS — E0500 Thyrotoxicosis with diffuse goiter without thyrotoxic crisis or storm: Secondary | ICD-10-CM | POA: Insufficient documentation

## 2012-06-03 DIAGNOSIS — H269 Unspecified cataract: Secondary | ICD-10-CM | POA: Insufficient documentation

## 2012-06-03 DIAGNOSIS — N209 Urinary calculus, unspecified: Secondary | ICD-10-CM | POA: Insufficient documentation

## 2012-06-03 DIAGNOSIS — I480 Paroxysmal atrial fibrillation: Secondary | ICD-10-CM | POA: Insufficient documentation

## 2012-06-03 DIAGNOSIS — I340 Nonrheumatic mitral (valve) insufficiency: Secondary | ICD-10-CM | POA: Insufficient documentation

## 2012-06-03 DIAGNOSIS — I5022 Chronic systolic (congestive) heart failure: Secondary | ICD-10-CM | POA: Insufficient documentation

## 2012-06-03 DIAGNOSIS — I10 Essential (primary) hypertension: Secondary | ICD-10-CM | POA: Insufficient documentation

## 2012-06-03 DIAGNOSIS — E785 Hyperlipidemia, unspecified: Secondary | ICD-10-CM | POA: Insufficient documentation

## 2012-06-06 DIAGNOSIS — I509 Heart failure, unspecified: Secondary | ICD-10-CM | POA: Diagnosis present

## 2012-06-06 DIAGNOSIS — I10 Essential (primary) hypertension: Secondary | ICD-10-CM | POA: Diagnosis present

## 2012-06-06 DIAGNOSIS — J90 Pleural effusion, not elsewhere classified: Secondary | ICD-10-CM | POA: Diagnosis not present

## 2012-06-06 DIAGNOSIS — I5022 Chronic systolic (congestive) heart failure: Secondary | ICD-10-CM | POA: Diagnosis present

## 2012-06-06 DIAGNOSIS — Z4682 Encounter for fitting and adjustment of non-vascular catheter: Secondary | ICD-10-CM | POA: Diagnosis not present

## 2012-06-06 DIAGNOSIS — I4891 Unspecified atrial fibrillation: Secondary | ICD-10-CM | POA: Diagnosis present

## 2012-06-06 DIAGNOSIS — J9819 Other pulmonary collapse: Secondary | ICD-10-CM | POA: Diagnosis not present

## 2012-06-06 DIAGNOSIS — I2510 Atherosclerotic heart disease of native coronary artery without angina pectoris: Secondary | ICD-10-CM | POA: Diagnosis not present

## 2012-06-06 DIAGNOSIS — I059 Rheumatic mitral valve disease, unspecified: Secondary | ICD-10-CM | POA: Diagnosis not present

## 2012-06-06 DIAGNOSIS — R5381 Other malaise: Secondary | ICD-10-CM | POA: Diagnosis not present

## 2012-06-06 DIAGNOSIS — I359 Nonrheumatic aortic valve disorder, unspecified: Secondary | ICD-10-CM | POA: Diagnosis not present

## 2012-06-06 DIAGNOSIS — E785 Hyperlipidemia, unspecified: Secondary | ICD-10-CM | POA: Diagnosis present

## 2012-06-06 DIAGNOSIS — I441 Atrioventricular block, second degree: Secondary | ICD-10-CM | POA: Diagnosis present

## 2012-06-06 DIAGNOSIS — I517 Cardiomegaly: Secondary | ICD-10-CM | POA: Diagnosis not present

## 2012-06-06 DIAGNOSIS — D696 Thrombocytopenia, unspecified: Secondary | ICD-10-CM | POA: Diagnosis present

## 2012-06-06 DIAGNOSIS — I252 Old myocardial infarction: Secondary | ICD-10-CM | POA: Diagnosis not present

## 2012-06-07 DIAGNOSIS — I2510 Atherosclerotic heart disease of native coronary artery without angina pectoris: Secondary | ICD-10-CM | POA: Insufficient documentation

## 2012-06-07 DIAGNOSIS — E66811 Obesity, class 1: Secondary | ICD-10-CM | POA: Insufficient documentation

## 2012-06-07 DIAGNOSIS — I499 Cardiac arrhythmia, unspecified: Secondary | ICD-10-CM | POA: Insufficient documentation

## 2012-06-07 DIAGNOSIS — E669 Obesity, unspecified: Secondary | ICD-10-CM | POA: Insufficient documentation

## 2012-06-07 DIAGNOSIS — K219 Gastro-esophageal reflux disease without esophagitis: Secondary | ICD-10-CM | POA: Insufficient documentation

## 2012-06-08 DIAGNOSIS — D62 Acute posthemorrhagic anemia: Secondary | ICD-10-CM | POA: Insufficient documentation

## 2012-06-08 DIAGNOSIS — D696 Thrombocytopenia, unspecified: Secondary | ICD-10-CM | POA: Insufficient documentation

## 2012-06-16 DIAGNOSIS — I4891 Unspecified atrial fibrillation: Secondary | ICD-10-CM | POA: Diagnosis not present

## 2012-06-16 DIAGNOSIS — R791 Abnormal coagulation profile: Secondary | ICD-10-CM | POA: Diagnosis not present

## 2012-06-16 DIAGNOSIS — K449 Diaphragmatic hernia without obstruction or gangrene: Secondary | ICD-10-CM | POA: Diagnosis not present

## 2012-06-16 DIAGNOSIS — Z9889 Other specified postprocedural states: Secondary | ICD-10-CM | POA: Diagnosis not present

## 2012-06-16 DIAGNOSIS — G43909 Migraine, unspecified, not intractable, without status migrainosus: Secondary | ICD-10-CM | POA: Diagnosis not present

## 2012-06-18 DIAGNOSIS — T8140XA Infection following a procedure, unspecified, initial encounter: Secondary | ICD-10-CM | POA: Diagnosis not present

## 2012-06-18 DIAGNOSIS — I4891 Unspecified atrial fibrillation: Secondary | ICD-10-CM | POA: Diagnosis not present

## 2012-06-18 DIAGNOSIS — I10 Essential (primary) hypertension: Secondary | ICD-10-CM | POA: Diagnosis not present

## 2012-06-18 DIAGNOSIS — Y838 Other surgical procedures as the cause of abnormal reaction of the patient, or of later complication, without mention of misadventure at the time of the procedure: Secondary | ICD-10-CM | POA: Diagnosis not present

## 2012-06-18 DIAGNOSIS — Z7901 Long term (current) use of anticoagulants: Secondary | ICD-10-CM | POA: Diagnosis not present

## 2012-06-18 DIAGNOSIS — IMO0002 Reserved for concepts with insufficient information to code with codable children: Secondary | ICD-10-CM | POA: Diagnosis not present

## 2012-06-23 DIAGNOSIS — I4891 Unspecified atrial fibrillation: Secondary | ICD-10-CM | POA: Diagnosis not present

## 2012-06-23 DIAGNOSIS — R0602 Shortness of breath: Secondary | ICD-10-CM | POA: Diagnosis not present

## 2012-06-23 DIAGNOSIS — R791 Abnormal coagulation profile: Secondary | ICD-10-CM | POA: Diagnosis not present

## 2012-06-23 DIAGNOSIS — I10 Essential (primary) hypertension: Secondary | ICD-10-CM | POA: Diagnosis not present

## 2012-06-23 DIAGNOSIS — E785 Hyperlipidemia, unspecified: Secondary | ICD-10-CM | POA: Diagnosis not present

## 2012-06-23 DIAGNOSIS — I059 Rheumatic mitral valve disease, unspecified: Secondary | ICD-10-CM | POA: Diagnosis not present

## 2012-06-27 DIAGNOSIS — N390 Urinary tract infection, site not specified: Secondary | ICD-10-CM | POA: Diagnosis not present

## 2012-06-27 DIAGNOSIS — L293 Anogenital pruritus, unspecified: Secondary | ICD-10-CM | POA: Diagnosis not present

## 2012-06-27 DIAGNOSIS — I4891 Unspecified atrial fibrillation: Secondary | ICD-10-CM | POA: Diagnosis not present

## 2012-06-27 DIAGNOSIS — Z6834 Body mass index (BMI) 34.0-34.9, adult: Secondary | ICD-10-CM | POA: Diagnosis not present

## 2012-06-29 DIAGNOSIS — E039 Hypothyroidism, unspecified: Secondary | ICD-10-CM | POA: Diagnosis not present

## 2012-06-29 DIAGNOSIS — R791 Abnormal coagulation profile: Secondary | ICD-10-CM | POA: Diagnosis not present

## 2012-06-30 DIAGNOSIS — Z4889 Encounter for other specified surgical aftercare: Secondary | ICD-10-CM | POA: Diagnosis not present

## 2012-06-30 DIAGNOSIS — Z48812 Encounter for surgical aftercare following surgery on the circulatory system: Secondary | ICD-10-CM | POA: Diagnosis not present

## 2012-06-30 DIAGNOSIS — Z5181 Encounter for therapeutic drug level monitoring: Secondary | ICD-10-CM | POA: Diagnosis not present

## 2012-06-30 DIAGNOSIS — Z7982 Long term (current) use of aspirin: Secondary | ICD-10-CM | POA: Diagnosis not present

## 2012-06-30 DIAGNOSIS — Z87440 Personal history of urinary (tract) infections: Secondary | ICD-10-CM | POA: Diagnosis not present

## 2012-06-30 DIAGNOSIS — J9819 Other pulmonary collapse: Secondary | ICD-10-CM | POA: Diagnosis not present

## 2012-06-30 DIAGNOSIS — Z79899 Other long term (current) drug therapy: Secondary | ICD-10-CM | POA: Diagnosis not present

## 2012-06-30 DIAGNOSIS — Z9889 Other specified postprocedural states: Secondary | ICD-10-CM | POA: Diagnosis not present

## 2012-07-01 DIAGNOSIS — E039 Hypothyroidism, unspecified: Secondary | ICD-10-CM | POA: Diagnosis not present

## 2012-07-01 DIAGNOSIS — N390 Urinary tract infection, site not specified: Secondary | ICD-10-CM | POA: Insufficient documentation

## 2012-07-06 DIAGNOSIS — R791 Abnormal coagulation profile: Secondary | ICD-10-CM | POA: Diagnosis not present

## 2012-07-11 DIAGNOSIS — Z6834 Body mass index (BMI) 34.0-34.9, adult: Secondary | ICD-10-CM | POA: Diagnosis not present

## 2012-07-11 DIAGNOSIS — L905 Scar conditions and fibrosis of skin: Secondary | ICD-10-CM | POA: Diagnosis not present

## 2012-07-11 DIAGNOSIS — I10 Essential (primary) hypertension: Secondary | ICD-10-CM | POA: Diagnosis not present

## 2012-07-11 DIAGNOSIS — R3989 Other symptoms and signs involving the genitourinary system: Secondary | ICD-10-CM | POA: Diagnosis not present

## 2012-07-11 DIAGNOSIS — R791 Abnormal coagulation profile: Secondary | ICD-10-CM | POA: Diagnosis not present

## 2012-07-19 DIAGNOSIS — R791 Abnormal coagulation profile: Secondary | ICD-10-CM | POA: Diagnosis not present

## 2012-07-21 DIAGNOSIS — I4891 Unspecified atrial fibrillation: Secondary | ICD-10-CM | POA: Diagnosis not present

## 2012-07-21 DIAGNOSIS — E785 Hyperlipidemia, unspecified: Secondary | ICD-10-CM | POA: Diagnosis not present

## 2012-07-21 DIAGNOSIS — Z79899 Other long term (current) drug therapy: Secondary | ICD-10-CM | POA: Diagnosis not present

## 2012-07-21 DIAGNOSIS — I2510 Atherosclerotic heart disease of native coronary artery without angina pectoris: Secondary | ICD-10-CM | POA: Diagnosis not present

## 2012-07-21 DIAGNOSIS — I10 Essential (primary) hypertension: Secondary | ICD-10-CM | POA: Diagnosis not present

## 2012-07-27 DIAGNOSIS — I252 Old myocardial infarction: Secondary | ICD-10-CM | POA: Diagnosis not present

## 2012-07-27 DIAGNOSIS — R791 Abnormal coagulation profile: Secondary | ICD-10-CM | POA: Diagnosis not present

## 2012-07-27 DIAGNOSIS — I4891 Unspecified atrial fibrillation: Secondary | ICD-10-CM | POA: Diagnosis not present

## 2012-07-27 DIAGNOSIS — Z6834 Body mass index (BMI) 34.0-34.9, adult: Secondary | ICD-10-CM | POA: Diagnosis not present

## 2012-07-27 DIAGNOSIS — B3784 Candidal otitis externa: Secondary | ICD-10-CM | POA: Diagnosis not present

## 2012-07-27 DIAGNOSIS — I2510 Atherosclerotic heart disease of native coronary artery without angina pectoris: Secondary | ICD-10-CM | POA: Diagnosis not present

## 2012-07-27 DIAGNOSIS — I059 Rheumatic mitral valve disease, unspecified: Secondary | ICD-10-CM | POA: Diagnosis not present

## 2012-07-28 DIAGNOSIS — I4891 Unspecified atrial fibrillation: Secondary | ICD-10-CM | POA: Diagnosis not present

## 2012-08-02 DIAGNOSIS — R791 Abnormal coagulation profile: Secondary | ICD-10-CM | POA: Diagnosis not present

## 2012-08-04 DIAGNOSIS — I059 Rheumatic mitral valve disease, unspecified: Secondary | ICD-10-CM | POA: Diagnosis not present

## 2012-08-09 DIAGNOSIS — Z954 Presence of other heart-valve replacement: Secondary | ICD-10-CM | POA: Diagnosis not present

## 2012-08-09 DIAGNOSIS — R791 Abnormal coagulation profile: Secondary | ICD-10-CM | POA: Diagnosis not present

## 2012-08-09 DIAGNOSIS — Z5189 Encounter for other specified aftercare: Secondary | ICD-10-CM | POA: Diagnosis not present

## 2012-08-10 DIAGNOSIS — Z5189 Encounter for other specified aftercare: Secondary | ICD-10-CM | POA: Diagnosis not present

## 2012-08-10 DIAGNOSIS — Z954 Presence of other heart-valve replacement: Secondary | ICD-10-CM | POA: Diagnosis not present

## 2012-08-12 DIAGNOSIS — E039 Hypothyroidism, unspecified: Secondary | ICD-10-CM | POA: Diagnosis not present

## 2012-08-12 DIAGNOSIS — Z5189 Encounter for other specified aftercare: Secondary | ICD-10-CM | POA: Diagnosis not present

## 2012-08-12 DIAGNOSIS — Z954 Presence of other heart-valve replacement: Secondary | ICD-10-CM | POA: Diagnosis not present

## 2012-08-15 DIAGNOSIS — Z5189 Encounter for other specified aftercare: Secondary | ICD-10-CM | POA: Diagnosis not present

## 2012-08-15 DIAGNOSIS — R791 Abnormal coagulation profile: Secondary | ICD-10-CM | POA: Diagnosis not present

## 2012-08-15 DIAGNOSIS — Z954 Presence of other heart-valve replacement: Secondary | ICD-10-CM | POA: Diagnosis not present

## 2012-08-16 DIAGNOSIS — E039 Hypothyroidism, unspecified: Secondary | ICD-10-CM | POA: Diagnosis not present

## 2012-08-22 DIAGNOSIS — Z954 Presence of other heart-valve replacement: Secondary | ICD-10-CM | POA: Diagnosis not present

## 2012-08-22 DIAGNOSIS — Z5189 Encounter for other specified aftercare: Secondary | ICD-10-CM | POA: Diagnosis not present

## 2012-08-24 DIAGNOSIS — Z5189 Encounter for other specified aftercare: Secondary | ICD-10-CM | POA: Diagnosis not present

## 2012-08-24 DIAGNOSIS — Z954 Presence of other heart-valve replacement: Secondary | ICD-10-CM | POA: Diagnosis not present

## 2012-08-26 DIAGNOSIS — Z954 Presence of other heart-valve replacement: Secondary | ICD-10-CM | POA: Diagnosis not present

## 2012-08-26 DIAGNOSIS — Z5189 Encounter for other specified aftercare: Secondary | ICD-10-CM | POA: Diagnosis not present

## 2012-08-29 DIAGNOSIS — R791 Abnormal coagulation profile: Secondary | ICD-10-CM | POA: Diagnosis not present

## 2012-08-29 DIAGNOSIS — Z5189 Encounter for other specified aftercare: Secondary | ICD-10-CM | POA: Diagnosis not present

## 2012-08-29 DIAGNOSIS — Z954 Presence of other heart-valve replacement: Secondary | ICD-10-CM | POA: Diagnosis not present

## 2012-08-31 DIAGNOSIS — Z954 Presence of other heart-valve replacement: Secondary | ICD-10-CM | POA: Diagnosis not present

## 2012-08-31 DIAGNOSIS — Z5189 Encounter for other specified aftercare: Secondary | ICD-10-CM | POA: Diagnosis not present

## 2012-09-02 DIAGNOSIS — Z954 Presence of other heart-valve replacement: Secondary | ICD-10-CM | POA: Diagnosis not present

## 2012-09-02 DIAGNOSIS — Z5189 Encounter for other specified aftercare: Secondary | ICD-10-CM | POA: Diagnosis not present

## 2012-09-05 DIAGNOSIS — Z954 Presence of other heart-valve replacement: Secondary | ICD-10-CM | POA: Diagnosis not present

## 2012-09-05 DIAGNOSIS — Z5189 Encounter for other specified aftercare: Secondary | ICD-10-CM | POA: Diagnosis not present

## 2012-09-07 DIAGNOSIS — Z954 Presence of other heart-valve replacement: Secondary | ICD-10-CM | POA: Diagnosis not present

## 2012-09-07 DIAGNOSIS — Z5189 Encounter for other specified aftercare: Secondary | ICD-10-CM | POA: Diagnosis not present

## 2012-09-09 DIAGNOSIS — Z5189 Encounter for other specified aftercare: Secondary | ICD-10-CM | POA: Diagnosis not present

## 2012-09-09 DIAGNOSIS — Z954 Presence of other heart-valve replacement: Secondary | ICD-10-CM | POA: Diagnosis not present

## 2012-09-09 DIAGNOSIS — R791 Abnormal coagulation profile: Secondary | ICD-10-CM | POA: Diagnosis not present

## 2012-09-19 DIAGNOSIS — Z5189 Encounter for other specified aftercare: Secondary | ICD-10-CM | POA: Diagnosis not present

## 2012-09-19 DIAGNOSIS — Z954 Presence of other heart-valve replacement: Secondary | ICD-10-CM | POA: Diagnosis not present

## 2012-09-23 DIAGNOSIS — E785 Hyperlipidemia, unspecified: Secondary | ICD-10-CM | POA: Diagnosis not present

## 2012-09-23 DIAGNOSIS — Z5189 Encounter for other specified aftercare: Secondary | ICD-10-CM | POA: Diagnosis not present

## 2012-09-23 DIAGNOSIS — I2510 Atherosclerotic heart disease of native coronary artery without angina pectoris: Secondary | ICD-10-CM | POA: Diagnosis not present

## 2012-09-23 DIAGNOSIS — Z954 Presence of other heart-valve replacement: Secondary | ICD-10-CM | POA: Diagnosis not present

## 2012-09-23 DIAGNOSIS — R791 Abnormal coagulation profile: Secondary | ICD-10-CM | POA: Diagnosis not present

## 2012-09-26 DIAGNOSIS — Z954 Presence of other heart-valve replacement: Secondary | ICD-10-CM | POA: Diagnosis not present

## 2012-09-26 DIAGNOSIS — I10 Essential (primary) hypertension: Secondary | ICD-10-CM | POA: Diagnosis not present

## 2012-09-26 DIAGNOSIS — I2510 Atherosclerotic heart disease of native coronary artery without angina pectoris: Secondary | ICD-10-CM | POA: Diagnosis not present

## 2012-09-26 DIAGNOSIS — Z5189 Encounter for other specified aftercare: Secondary | ICD-10-CM | POA: Diagnosis not present

## 2012-09-26 DIAGNOSIS — I059 Rheumatic mitral valve disease, unspecified: Secondary | ICD-10-CM | POA: Diagnosis not present

## 2012-09-26 DIAGNOSIS — E785 Hyperlipidemia, unspecified: Secondary | ICD-10-CM | POA: Diagnosis not present

## 2012-09-26 DIAGNOSIS — I4891 Unspecified atrial fibrillation: Secondary | ICD-10-CM | POA: Diagnosis not present

## 2012-09-29 DIAGNOSIS — H2589 Other age-related cataract: Secondary | ICD-10-CM | POA: Diagnosis not present

## 2012-09-29 DIAGNOSIS — H524 Presbyopia: Secondary | ICD-10-CM | POA: Diagnosis not present

## 2012-09-30 DIAGNOSIS — R791 Abnormal coagulation profile: Secondary | ICD-10-CM | POA: Diagnosis not present

## 2012-09-30 DIAGNOSIS — Z5189 Encounter for other specified aftercare: Secondary | ICD-10-CM | POA: Diagnosis not present

## 2012-09-30 DIAGNOSIS — Z954 Presence of other heart-valve replacement: Secondary | ICD-10-CM | POA: Diagnosis not present

## 2012-10-03 DIAGNOSIS — Z5189 Encounter for other specified aftercare: Secondary | ICD-10-CM | POA: Diagnosis not present

## 2012-10-03 DIAGNOSIS — Z954 Presence of other heart-valve replacement: Secondary | ICD-10-CM | POA: Diagnosis not present

## 2012-10-10 DIAGNOSIS — Z7901 Long term (current) use of anticoagulants: Secondary | ICD-10-CM | POA: Diagnosis not present

## 2012-10-10 DIAGNOSIS — J309 Allergic rhinitis, unspecified: Secondary | ICD-10-CM | POA: Diagnosis not present

## 2012-10-10 DIAGNOSIS — E039 Hypothyroidism, unspecified: Secondary | ICD-10-CM | POA: Diagnosis not present

## 2012-10-10 DIAGNOSIS — H60399 Other infective otitis externa, unspecified ear: Secondary | ICD-10-CM | POA: Diagnosis not present

## 2012-10-10 DIAGNOSIS — N318 Other neuromuscular dysfunction of bladder: Secondary | ICD-10-CM | POA: Diagnosis not present

## 2012-10-14 DIAGNOSIS — Z954 Presence of other heart-valve replacement: Secondary | ICD-10-CM | POA: Diagnosis not present

## 2012-10-14 DIAGNOSIS — Z5189 Encounter for other specified aftercare: Secondary | ICD-10-CM | POA: Diagnosis not present

## 2012-10-17 DIAGNOSIS — Z954 Presence of other heart-valve replacement: Secondary | ICD-10-CM | POA: Diagnosis not present

## 2012-10-17 DIAGNOSIS — R791 Abnormal coagulation profile: Secondary | ICD-10-CM | POA: Diagnosis not present

## 2012-10-17 DIAGNOSIS — Z5189 Encounter for other specified aftercare: Secondary | ICD-10-CM | POA: Diagnosis not present

## 2012-10-24 DIAGNOSIS — Z954 Presence of other heart-valve replacement: Secondary | ICD-10-CM | POA: Diagnosis not present

## 2012-10-24 DIAGNOSIS — Z5189 Encounter for other specified aftercare: Secondary | ICD-10-CM | POA: Diagnosis not present

## 2012-10-26 DIAGNOSIS — Z6835 Body mass index (BMI) 35.0-35.9, adult: Secondary | ICD-10-CM | POA: Diagnosis not present

## 2012-10-26 DIAGNOSIS — I10 Essential (primary) hypertension: Secondary | ICD-10-CM | POA: Diagnosis not present

## 2012-10-26 DIAGNOSIS — R791 Abnormal coagulation profile: Secondary | ICD-10-CM | POA: Diagnosis not present

## 2012-10-26 DIAGNOSIS — Z5189 Encounter for other specified aftercare: Secondary | ICD-10-CM | POA: Diagnosis not present

## 2012-10-26 DIAGNOSIS — E039 Hypothyroidism, unspecified: Secondary | ICD-10-CM | POA: Diagnosis not present

## 2012-10-26 DIAGNOSIS — Z954 Presence of other heart-valve replacement: Secondary | ICD-10-CM | POA: Diagnosis not present

## 2012-10-26 DIAGNOSIS — E782 Mixed hyperlipidemia: Secondary | ICD-10-CM | POA: Diagnosis not present

## 2012-10-31 DIAGNOSIS — Z5189 Encounter for other specified aftercare: Secondary | ICD-10-CM | POA: Diagnosis not present

## 2012-10-31 DIAGNOSIS — Z954 Presence of other heart-valve replacement: Secondary | ICD-10-CM | POA: Diagnosis not present

## 2012-11-01 DIAGNOSIS — E039 Hypothyroidism, unspecified: Secondary | ICD-10-CM | POA: Diagnosis not present

## 2012-11-07 DIAGNOSIS — Z954 Presence of other heart-valve replacement: Secondary | ICD-10-CM | POA: Diagnosis not present

## 2012-11-07 DIAGNOSIS — Z5189 Encounter for other specified aftercare: Secondary | ICD-10-CM | POA: Diagnosis not present

## 2012-11-23 DIAGNOSIS — R791 Abnormal coagulation profile: Secondary | ICD-10-CM | POA: Diagnosis not present

## 2012-11-28 DIAGNOSIS — I4891 Unspecified atrial fibrillation: Secondary | ICD-10-CM | POA: Diagnosis not present

## 2012-11-28 DIAGNOSIS — Z79899 Other long term (current) drug therapy: Secondary | ICD-10-CM | POA: Diagnosis not present

## 2012-11-28 DIAGNOSIS — E785 Hyperlipidemia, unspecified: Secondary | ICD-10-CM | POA: Diagnosis not present

## 2012-12-22 DIAGNOSIS — E785 Hyperlipidemia, unspecified: Secondary | ICD-10-CM | POA: Diagnosis not present

## 2012-12-22 DIAGNOSIS — R791 Abnormal coagulation profile: Secondary | ICD-10-CM | POA: Diagnosis not present

## 2012-12-22 DIAGNOSIS — I2510 Atherosclerotic heart disease of native coronary artery without angina pectoris: Secondary | ICD-10-CM | POA: Diagnosis not present

## 2012-12-22 DIAGNOSIS — I4891 Unspecified atrial fibrillation: Secondary | ICD-10-CM | POA: Diagnosis not present

## 2012-12-22 DIAGNOSIS — I429 Cardiomyopathy, unspecified: Secondary | ICD-10-CM | POA: Diagnosis not present

## 2012-12-22 DIAGNOSIS — I10 Essential (primary) hypertension: Secondary | ICD-10-CM | POA: Diagnosis not present

## 2012-12-29 DIAGNOSIS — R209 Unspecified disturbances of skin sensation: Secondary | ICD-10-CM | POA: Diagnosis not present

## 2012-12-29 DIAGNOSIS — E039 Hypothyroidism, unspecified: Secondary | ICD-10-CM | POA: Diagnosis not present

## 2012-12-29 DIAGNOSIS — E785 Hyperlipidemia, unspecified: Secondary | ICD-10-CM | POA: Diagnosis not present

## 2012-12-29 DIAGNOSIS — Z79899 Other long term (current) drug therapy: Secondary | ICD-10-CM | POA: Diagnosis not present

## 2012-12-29 DIAGNOSIS — R791 Abnormal coagulation profile: Secondary | ICD-10-CM | POA: Diagnosis not present

## 2012-12-29 DIAGNOSIS — N318 Other neuromuscular dysfunction of bladder: Secondary | ICD-10-CM | POA: Diagnosis not present

## 2012-12-29 DIAGNOSIS — M159 Polyosteoarthritis, unspecified: Secondary | ICD-10-CM | POA: Diagnosis not present

## 2012-12-29 DIAGNOSIS — R5381 Other malaise: Secondary | ICD-10-CM | POA: Diagnosis not present

## 2012-12-29 DIAGNOSIS — E559 Vitamin D deficiency, unspecified: Secondary | ICD-10-CM | POA: Diagnosis not present

## 2013-01-19 DIAGNOSIS — I10 Essential (primary) hypertension: Secondary | ICD-10-CM | POA: Diagnosis not present

## 2013-01-19 DIAGNOSIS — E785 Hyperlipidemia, unspecified: Secondary | ICD-10-CM | POA: Diagnosis not present

## 2013-01-19 DIAGNOSIS — I2510 Atherosclerotic heart disease of native coronary artery without angina pectoris: Secondary | ICD-10-CM | POA: Diagnosis not present

## 2013-01-19 DIAGNOSIS — I059 Rheumatic mitral valve disease, unspecified: Secondary | ICD-10-CM | POA: Diagnosis not present

## 2013-01-19 DIAGNOSIS — I4891 Unspecified atrial fibrillation: Secondary | ICD-10-CM | POA: Diagnosis not present

## 2013-01-23 DIAGNOSIS — R791 Abnormal coagulation profile: Secondary | ICD-10-CM | POA: Diagnosis not present

## 2013-01-23 DIAGNOSIS — I10 Essential (primary) hypertension: Secondary | ICD-10-CM | POA: Diagnosis not present

## 2013-01-23 DIAGNOSIS — E559 Vitamin D deficiency, unspecified: Secondary | ICD-10-CM | POA: Diagnosis not present

## 2013-01-23 DIAGNOSIS — I4891 Unspecified atrial fibrillation: Secondary | ICD-10-CM | POA: Diagnosis not present

## 2013-01-23 DIAGNOSIS — E782 Mixed hyperlipidemia: Secondary | ICD-10-CM | POA: Diagnosis not present

## 2013-01-30 DIAGNOSIS — R791 Abnormal coagulation profile: Secondary | ICD-10-CM | POA: Diagnosis not present

## 2013-02-06 DIAGNOSIS — E039 Hypothyroidism, unspecified: Secondary | ICD-10-CM | POA: Diagnosis not present

## 2013-02-09 DIAGNOSIS — E039 Hypothyroidism, unspecified: Secondary | ICD-10-CM | POA: Diagnosis not present

## 2013-02-15 DIAGNOSIS — Z7901 Long term (current) use of anticoagulants: Secondary | ICD-10-CM | POA: Diagnosis not present

## 2013-02-21 DIAGNOSIS — R791 Abnormal coagulation profile: Secondary | ICD-10-CM | POA: Diagnosis not present

## 2013-02-28 DIAGNOSIS — R791 Abnormal coagulation profile: Secondary | ICD-10-CM | POA: Diagnosis not present

## 2013-03-07 DIAGNOSIS — R791 Abnormal coagulation profile: Secondary | ICD-10-CM | POA: Diagnosis not present

## 2013-03-14 DIAGNOSIS — Z7901 Long term (current) use of anticoagulants: Secondary | ICD-10-CM | POA: Diagnosis not present

## 2013-03-22 DIAGNOSIS — N390 Urinary tract infection, site not specified: Secondary | ICD-10-CM | POA: Diagnosis not present

## 2013-03-22 DIAGNOSIS — Z79899 Other long term (current) drug therapy: Secondary | ICD-10-CM | POA: Diagnosis not present

## 2013-03-22 DIAGNOSIS — E039 Hypothyroidism, unspecified: Secondary | ICD-10-CM | POA: Diagnosis not present

## 2013-03-22 DIAGNOSIS — Z7901 Long term (current) use of anticoagulants: Secondary | ICD-10-CM | POA: Diagnosis not present

## 2013-03-22 DIAGNOSIS — E782 Mixed hyperlipidemia: Secondary | ICD-10-CM | POA: Diagnosis not present

## 2013-03-22 DIAGNOSIS — I10 Essential (primary) hypertension: Secondary | ICD-10-CM | POA: Diagnosis not present

## 2013-03-28 DIAGNOSIS — R791 Abnormal coagulation profile: Secondary | ICD-10-CM | POA: Diagnosis not present

## 2013-04-04 DIAGNOSIS — R791 Abnormal coagulation profile: Secondary | ICD-10-CM | POA: Diagnosis not present

## 2013-04-11 DIAGNOSIS — Z7901 Long term (current) use of anticoagulants: Secondary | ICD-10-CM | POA: Diagnosis not present

## 2013-05-01 DIAGNOSIS — I10 Essential (primary) hypertension: Secondary | ICD-10-CM | POA: Diagnosis not present

## 2013-05-01 DIAGNOSIS — Z0000 Encounter for general adult medical examination without abnormal findings: Secondary | ICD-10-CM | POA: Diagnosis not present

## 2013-05-01 DIAGNOSIS — E785 Hyperlipidemia, unspecified: Secondary | ICD-10-CM | POA: Diagnosis not present

## 2013-05-01 DIAGNOSIS — I4891 Unspecified atrial fibrillation: Secondary | ICD-10-CM | POA: Diagnosis not present

## 2013-05-01 DIAGNOSIS — I2510 Atherosclerotic heart disease of native coronary artery without angina pectoris: Secondary | ICD-10-CM | POA: Diagnosis not present

## 2013-05-16 DIAGNOSIS — R109 Unspecified abdominal pain: Secondary | ICD-10-CM | POA: Diagnosis not present

## 2013-05-16 DIAGNOSIS — R791 Abnormal coagulation profile: Secondary | ICD-10-CM | POA: Diagnosis not present

## 2013-05-16 DIAGNOSIS — N343 Urethral syndrome, unspecified: Secondary | ICD-10-CM | POA: Diagnosis not present

## 2013-05-16 DIAGNOSIS — Z6834 Body mass index (BMI) 34.0-34.9, adult: Secondary | ICD-10-CM | POA: Diagnosis not present

## 2013-05-29 DIAGNOSIS — E559 Vitamin D deficiency, unspecified: Secondary | ICD-10-CM | POA: Diagnosis not present

## 2013-05-29 DIAGNOSIS — R42 Dizziness and giddiness: Secondary | ICD-10-CM | POA: Diagnosis not present

## 2013-05-29 DIAGNOSIS — H68009 Unspecified Eustachian salpingitis, unspecified ear: Secondary | ICD-10-CM | POA: Diagnosis not present

## 2013-05-29 DIAGNOSIS — J309 Allergic rhinitis, unspecified: Secondary | ICD-10-CM | POA: Diagnosis not present

## 2013-05-29 DIAGNOSIS — E782 Mixed hyperlipidemia: Secondary | ICD-10-CM | POA: Diagnosis not present

## 2013-05-31 DIAGNOSIS — N952 Postmenopausal atrophic vaginitis: Secondary | ICD-10-CM | POA: Diagnosis not present

## 2013-05-31 DIAGNOSIS — N390 Urinary tract infection, site not specified: Secondary | ICD-10-CM | POA: Diagnosis not present

## 2013-05-31 DIAGNOSIS — N3946 Mixed incontinence: Secondary | ICD-10-CM | POA: Diagnosis not present

## 2013-05-31 DIAGNOSIS — N3289 Other specified disorders of bladder: Secondary | ICD-10-CM | POA: Diagnosis not present

## 2013-06-06 ENCOUNTER — Ambulatory Visit (INDEPENDENT_AMBULATORY_CARE_PROVIDER_SITE_OTHER): Payer: Medicare Other | Admitting: Endocrinology

## 2013-06-06 ENCOUNTER — Encounter: Payer: Self-pay | Admitting: Endocrinology

## 2013-06-06 VITALS — BP 126/82 | HR 64 | Temp 98.3°F | Resp 12 | Ht 65.5 in | Wt 215.6 lb

## 2013-06-06 DIAGNOSIS — I10 Essential (primary) hypertension: Secondary | ICD-10-CM

## 2013-06-06 DIAGNOSIS — E890 Postprocedural hypothyroidism: Secondary | ICD-10-CM | POA: Diagnosis not present

## 2013-06-06 LAB — T4, FREE: Free T4: 1.04 ng/dL (ref 0.60–1.60)

## 2013-06-06 LAB — TSH: TSH: 3.57 u[IU]/mL (ref 0.35–5.50)

## 2013-06-06 NOTE — Progress Notes (Signed)
Patient ID: Chelsea Warren, female   DOB: [birth year], 72 y.o.   MRN: [id number]  Reason for Appointment:  Hypothyroidism, followup visit    History of Present Illness:   The hypothyroidism was first diagnosed in 1964 Complaints are reported by the patient now are palpitations which is described as a sensation of pounding behind her eyes and a funny sensation in her head which starts about 40 minutes after taking her Synthroid in the morning. This tends to be more when she is active. She does not feel her heart racing or chest and no shakiness or heat intolerance She thinks her TSH with her PCP was 1.33 about 3 months ago          The treatments that the patient has taken include Synthroid 200 for quite some time           Compliance with the medical regimen has been as prescribed with taking the tablet in the morning before breakfast. However yesterday she did not take her thyroid supplement till late morning and did not have as many palpitations She does have a history of atrial arrhythmia which has been treated.    Medication List       This list is accurate as of: 06/06/13 10:43 AM.  Always use your most recent med list.               ALPRAZolam 0.5 MG tablet  Commonly known as:  XANAX  Take 0.5 mg by mouth 3 (three) times daily as needed for sleep.     aspirin 81 MG tablet  Take 81 mg by mouth daily.     furosemide 20 MG tablet  Commonly known as:  LASIX  Take 20 mg by mouth. PRN     levothyroxine 200 MCG tablet  Commonly known as:  SYNTHROID, LEVOTHROID  Take 200 mcg by mouth daily before breakfast.     lisinopril 10 MG tablet  Commonly known as:  PRINIVIL,ZESTRIL  Take 10 mg by mouth daily.     warfarin 5 MG tablet  Commonly known as:  COUMADIN  Take 5 mg by mouth daily. 1 tablet 5 days a week and half tablet 2 days a week        No past medical history on file.  No past surgical history on file.  No family history on file.  Social History:  reports that  she has never smoked. She has never used smokeless tobacco. Her alcohol and drug histories are not on file.  Allergies:  Allergies  Allergen Reactions  . Lidocaine      Examination:   BP 126/82  Pulse 64  Temp(Src) 98.3 F (36.8 C)  Resp 12  Ht 5' 5.5" (1.664 m)  Wt 215 lb 9.6 oz (97.796 kg)  BMI 35.32 kg/m2  SpO2 96%   GENERAL APPEARANCE: Alert And looks well. no facial puffiness No swelling of feet or fingers       NECK: no thyromegaly.          NEUROLOGIC EXAM: DTRs 2+ bilaterally at biceps.    Assessments   Hypothyroidism, post surgical and long-standing She is having some unusual and new symptoms of feeling  pulsations/palpitations behind her eyes after taking her thyroid supplement  Also has history of cardiac arrhythmia but her heart rate is relatively slow and regular Will check her thyroid level and decide on further adjustments   Zayda Angell 06/06/2013, 10:43 AM   Addendum: TSH in normal range, will continue same dosage,  she will discuss her symptoms with PCP or cardiologist  Lab Results  Component Value Date   TSH 3.57 06/06/2013

## 2013-06-06 NOTE — Progress Notes (Signed)
Quick Note:  Please let patient know that the lab result is normal and no change needed, palpitations not from thyroid ______

## 2013-06-06 NOTE — Patient Instructions (Addendum)
May take Synthroid at bedtime

## 2013-06-07 ENCOUNTER — Telehealth: Payer: Self-pay | Admitting: *Deleted

## 2013-06-07 NOTE — Telephone Encounter (Signed)
Left message on VM to return call 

## 2013-06-07 NOTE — Telephone Encounter (Signed)
Pt is aware of results. 

## 2013-06-07 NOTE — Telephone Encounter (Signed)
Message copied by Hermenia Bers on Wed Jun 07, 2013  9:24 AM ------      Message from: Reather Littler      Created: Tue Jun 06, 2013  9:41 PM       Please let patient know that the lab result is normal and no change needed, palpitations not from thyroid ------

## 2013-06-08 DIAGNOSIS — I10 Essential (primary) hypertension: Secondary | ICD-10-CM | POA: Insufficient documentation

## 2013-06-12 DIAGNOSIS — R0602 Shortness of breath: Secondary | ICD-10-CM | POA: Diagnosis not present

## 2013-06-12 DIAGNOSIS — I4891 Unspecified atrial fibrillation: Secondary | ICD-10-CM | POA: Diagnosis not present

## 2013-06-12 DIAGNOSIS — I5022 Chronic systolic (congestive) heart failure: Secondary | ICD-10-CM | POA: Diagnosis not present

## 2013-06-12 DIAGNOSIS — E785 Hyperlipidemia, unspecified: Secondary | ICD-10-CM | POA: Diagnosis not present

## 2013-06-12 DIAGNOSIS — I10 Essential (primary) hypertension: Secondary | ICD-10-CM | POA: Diagnosis not present

## 2013-06-12 DIAGNOSIS — I059 Rheumatic mitral valve disease, unspecified: Secondary | ICD-10-CM | POA: Diagnosis not present

## 2013-06-12 DIAGNOSIS — I509 Heart failure, unspecified: Secondary | ICD-10-CM | POA: Diagnosis not present

## 2013-06-13 DIAGNOSIS — R791 Abnormal coagulation profile: Secondary | ICD-10-CM | POA: Diagnosis not present

## 2013-06-21 DIAGNOSIS — N390 Urinary tract infection, site not specified: Secondary | ICD-10-CM | POA: Diagnosis not present

## 2013-06-21 DIAGNOSIS — N3946 Mixed incontinence: Secondary | ICD-10-CM | POA: Diagnosis not present

## 2013-06-21 DIAGNOSIS — R791 Abnormal coagulation profile: Secondary | ICD-10-CM | POA: Diagnosis not present

## 2013-06-21 DIAGNOSIS — R351 Nocturia: Secondary | ICD-10-CM | POA: Diagnosis not present

## 2013-07-03 DIAGNOSIS — R791 Abnormal coagulation profile: Secondary | ICD-10-CM | POA: Diagnosis not present

## 2013-07-03 DIAGNOSIS — I4891 Unspecified atrial fibrillation: Secondary | ICD-10-CM | POA: Diagnosis not present

## 2013-07-07 DIAGNOSIS — I4891 Unspecified atrial fibrillation: Secondary | ICD-10-CM | POA: Diagnosis not present

## 2013-07-19 DIAGNOSIS — J309 Allergic rhinitis, unspecified: Secondary | ICD-10-CM | POA: Diagnosis not present

## 2013-07-19 DIAGNOSIS — R791 Abnormal coagulation profile: Secondary | ICD-10-CM | POA: Diagnosis not present

## 2013-07-19 DIAGNOSIS — I10 Essential (primary) hypertension: Secondary | ICD-10-CM | POA: Diagnosis not present

## 2013-07-19 DIAGNOSIS — R42 Dizziness and giddiness: Secondary | ICD-10-CM | POA: Diagnosis not present

## 2013-07-27 DIAGNOSIS — I10 Essential (primary) hypertension: Secondary | ICD-10-CM | POA: Diagnosis not present

## 2013-07-27 DIAGNOSIS — I429 Cardiomyopathy, unspecified: Secondary | ICD-10-CM | POA: Diagnosis not present

## 2013-07-27 DIAGNOSIS — I4891 Unspecified atrial fibrillation: Secondary | ICD-10-CM | POA: Diagnosis not present

## 2013-08-01 DIAGNOSIS — N390 Urinary tract infection, site not specified: Secondary | ICD-10-CM | POA: Diagnosis not present

## 2013-08-01 DIAGNOSIS — I10 Essential (primary) hypertension: Secondary | ICD-10-CM | POA: Diagnosis not present

## 2013-08-01 DIAGNOSIS — R791 Abnormal coagulation profile: Secondary | ICD-10-CM | POA: Diagnosis not present

## 2013-08-01 DIAGNOSIS — E039 Hypothyroidism, unspecified: Secondary | ICD-10-CM | POA: Diagnosis not present

## 2013-08-01 DIAGNOSIS — J309 Allergic rhinitis, unspecified: Secondary | ICD-10-CM | POA: Diagnosis not present

## 2013-08-01 DIAGNOSIS — I4891 Unspecified atrial fibrillation: Secondary | ICD-10-CM | POA: Diagnosis not present

## 2013-08-01 DIAGNOSIS — K219 Gastro-esophageal reflux disease without esophagitis: Secondary | ICD-10-CM | POA: Diagnosis not present

## 2013-08-07 DIAGNOSIS — R791 Abnormal coagulation profile: Secondary | ICD-10-CM | POA: Diagnosis not present

## 2013-08-14 ENCOUNTER — Other Ambulatory Visit: Payer: Self-pay

## 2013-08-14 ENCOUNTER — Ambulatory Visit: Payer: Medicare Other | Admitting: Endocrinology

## 2013-08-14 DIAGNOSIS — R791 Abnormal coagulation profile: Secondary | ICD-10-CM | POA: Diagnosis not present

## 2013-08-28 DIAGNOSIS — R791 Abnormal coagulation profile: Secondary | ICD-10-CM | POA: Diagnosis not present

## 2013-09-04 DIAGNOSIS — R791 Abnormal coagulation profile: Secondary | ICD-10-CM | POA: Diagnosis not present

## 2013-09-11 DIAGNOSIS — R791 Abnormal coagulation profile: Secondary | ICD-10-CM | POA: Diagnosis not present

## 2013-09-18 DIAGNOSIS — R791 Abnormal coagulation profile: Secondary | ICD-10-CM | POA: Diagnosis not present

## 2013-09-25 DIAGNOSIS — R82998 Other abnormal findings in urine: Secondary | ICD-10-CM | POA: Diagnosis not present

## 2013-09-25 DIAGNOSIS — J111 Influenza due to unidentified influenza virus with other respiratory manifestations: Secondary | ICD-10-CM | POA: Diagnosis not present

## 2013-09-25 DIAGNOSIS — R52 Pain, unspecified: Secondary | ICD-10-CM | POA: Diagnosis not present

## 2013-09-25 DIAGNOSIS — R791 Abnormal coagulation profile: Secondary | ICD-10-CM | POA: Diagnosis not present

## 2013-10-03 DIAGNOSIS — N390 Urinary tract infection, site not specified: Secondary | ICD-10-CM | POA: Diagnosis not present

## 2013-10-03 DIAGNOSIS — R791 Abnormal coagulation profile: Secondary | ICD-10-CM | POA: Diagnosis not present

## 2013-10-03 DIAGNOSIS — I4891 Unspecified atrial fibrillation: Secondary | ICD-10-CM | POA: Diagnosis not present

## 2013-10-03 DIAGNOSIS — E538 Deficiency of other specified B group vitamins: Secondary | ICD-10-CM | POA: Diagnosis not present

## 2013-10-03 DIAGNOSIS — E559 Vitamin D deficiency, unspecified: Secondary | ICD-10-CM | POA: Diagnosis not present

## 2013-10-03 DIAGNOSIS — J309 Allergic rhinitis, unspecified: Secondary | ICD-10-CM | POA: Diagnosis not present

## 2013-10-03 DIAGNOSIS — E039 Hypothyroidism, unspecified: Secondary | ICD-10-CM | POA: Diagnosis not present

## 2013-10-10 DIAGNOSIS — Z7901 Long term (current) use of anticoagulants: Secondary | ICD-10-CM | POA: Diagnosis not present

## 2013-10-11 ENCOUNTER — Ambulatory Visit: Payer: Medicare Other | Admitting: Endocrinology

## 2013-10-12 ENCOUNTER — Encounter: Payer: Self-pay | Admitting: Endocrinology

## 2013-10-12 ENCOUNTER — Ambulatory Visit (INDEPENDENT_AMBULATORY_CARE_PROVIDER_SITE_OTHER): Payer: Medicare Other | Admitting: Endocrinology

## 2013-10-12 VITALS — BP 132/88 | HR 70 | Temp 98.2°F | Resp 16 | Ht 65.5 in | Wt 225.9 lb

## 2013-10-12 DIAGNOSIS — E890 Postprocedural hypothyroidism: Secondary | ICD-10-CM | POA: Diagnosis not present

## 2013-10-12 DIAGNOSIS — R61 Generalized hyperhidrosis: Secondary | ICD-10-CM | POA: Diagnosis not present

## 2013-10-12 MED ORDER — SYNTHROID 112 MCG PO TABS: 224.0000 ug | ORAL_TABLET | Freq: Every day | ORAL | Status: DC

## 2013-10-12 NOTE — Patient Instructions (Signed)
Synthroid 112ug, two in am daily  Try Clonidine 0.1mg  at bedtime

## 2013-10-12 NOTE — Progress Notes (Signed)
Patient ID: Chelsea Warren, female   DOB: 01-Oct-1940, 73 y.o.   MRN: [id number]  Reason for Appointment:  Hypothyroidism, followup visit    History of Present Illness:   The hypothyroidism was first diagnosed in 1964 after thyroidectomy  The treatments that the patient has taken include Synthroid 200 for quite some time           She was last seen in 05/2013 and since her TSH was normal at 3.6 her dose was continued Since she has had a history of palpitations and cardiac arrhythmias her TSH has not been treated to relatively low levels  Complaints are reported by the patient now are fatigue all the time, no energy for 2-3 months, night sweats. She does not complain of any cord intolerance and has had no swelling of her hands or unusual weight gain She had gone to her PCP earlier this month and her TSH was found to be 6.7 Compliance with the medical regimen has been as prescribed with taking the tablet in the morning before breakfast.  Lab Results  Component Value Date   TSH 3.57 06/06/2013   TSH 1.380 04/16/2010       Medication List       This list is accurate as of: 10/12/13  1:52 PM.  Always use your most recent med list.               ALPRAZolam 0.5 MG tablet  Commonly known as:  XANAX  Take 0.5 mg by mouth 3 (three) times daily as needed for sleep.     aspirin 81 MG tablet  Take 81 mg by mouth daily.     diltiazem 180 MG 24 hr capsule  Commonly known as:  DILACOR XR  Take 180 mg by mouth daily.     levothyroxine 200 MCG tablet  Commonly known as:  SYNTHROID, LEVOTHROID  Take 200 mcg by mouth daily before breakfast.     lisinopril 10 MG tablet  Commonly known as:  PRINIVIL,ZESTRIL  Take 10 mg by mouth daily.     lisinopril-hydrochlorothiazide 20-12.5 MG per tablet  Commonly known as:  PRINZIDE,ZESTORETIC  Take 1 tablet by mouth daily.     warfarin 5 MG tablet  Commonly known as:  COUMADIN  Take 5 mg by mouth daily.        No past medical history on  file.  No past surgical history on file.  No family history on file.  Social History:  reports that she has never smoked. She has never used smokeless tobacco. Her alcohol and drug histories are not on file.  Allergies:  Allergies  Allergen Reactions  . Lidocaine      Examination:   BP 132/88  Pulse 70  Temp(Src) 98.2 F (36.8 C)  Resp 16  Ht 5' 5.5" (1.664 m)  Wt 225 lb 14.4 oz (102.468 kg)  BMI 37.01 kg/m2  SpO2 96%   GENERAL APPEARANCE: Alert And looks well. no facial puffiness No swelling of feet or fingers              NEUROLOGIC EXAM:  biceps reflexes somewhat difficult to elicit but showed normal neurologic sedation    Assessments   Hypothyroidism, post surgical and long-standing She is having significant amount of fatigue which is somewhat out of proportion to her mildly increased TSH of 6.67 Also she is having night sweats/hot flashes which are unlikely to be related to her thyroid and most likely postmenopausal  PLAN:  Increase Synthroid  to 224 mcg, she will use 2 tablets of 112 mcg. Samples given. She will continue brand name Synthroid  She will followup in 6-8 weeks  She will discuss with her cardiologist about trying clonidine 0.1 mg at bedtime to help her night sweats if there is no contraindication. and new symptoms of feeling  pulsations/palpitations behind her eyes after taking her thyroid supplement    Aryeh Butterfield 10/12/2013, 1:52 PM

## 2013-10-17 DIAGNOSIS — Z7901 Long term (current) use of anticoagulants: Secondary | ICD-10-CM | POA: Diagnosis not present

## 2013-10-24 DIAGNOSIS — Z79899 Other long term (current) drug therapy: Secondary | ICD-10-CM | POA: Diagnosis not present

## 2013-10-24 DIAGNOSIS — Z7901 Long term (current) use of anticoagulants: Secondary | ICD-10-CM | POA: Diagnosis not present

## 2013-11-01 DIAGNOSIS — Z79899 Other long term (current) drug therapy: Secondary | ICD-10-CM | POA: Diagnosis not present

## 2013-11-01 DIAGNOSIS — I10 Essential (primary) hypertension: Secondary | ICD-10-CM | POA: Diagnosis not present

## 2013-11-01 DIAGNOSIS — E039 Hypothyroidism, unspecified: Secondary | ICD-10-CM | POA: Diagnosis not present

## 2013-11-01 DIAGNOSIS — J309 Allergic rhinitis, unspecified: Secondary | ICD-10-CM | POA: Diagnosis not present

## 2013-11-01 DIAGNOSIS — R791 Abnormal coagulation profile: Secondary | ICD-10-CM | POA: Diagnosis not present

## 2013-11-01 DIAGNOSIS — Z9889 Other specified postprocedural states: Secondary | ICD-10-CM | POA: Diagnosis not present

## 2013-11-08 DIAGNOSIS — Z7901 Long term (current) use of anticoagulants: Secondary | ICD-10-CM | POA: Diagnosis not present

## 2013-11-13 DIAGNOSIS — I4891 Unspecified atrial fibrillation: Secondary | ICD-10-CM | POA: Diagnosis not present

## 2013-11-13 DIAGNOSIS — I429 Cardiomyopathy, unspecified: Secondary | ICD-10-CM | POA: Diagnosis not present

## 2013-11-13 DIAGNOSIS — Z7901 Long term (current) use of anticoagulants: Secondary | ICD-10-CM | POA: Diagnosis not present

## 2013-11-13 DIAGNOSIS — I10 Essential (primary) hypertension: Secondary | ICD-10-CM | POA: Diagnosis not present

## 2013-11-16 ENCOUNTER — Ambulatory Visit: Payer: Medicare Other | Admitting: Endocrinology

## 2013-11-16 ENCOUNTER — Other Ambulatory Visit: Payer: Medicare Other

## 2013-11-21 ENCOUNTER — Ambulatory Visit (INDEPENDENT_AMBULATORY_CARE_PROVIDER_SITE_OTHER): Payer: Medicare Other | Admitting: Endocrinology

## 2013-11-21 ENCOUNTER — Encounter: Payer: Self-pay | Admitting: Endocrinology

## 2013-11-21 ENCOUNTER — Other Ambulatory Visit (INDEPENDENT_AMBULATORY_CARE_PROVIDER_SITE_OTHER): Payer: Medicare Other

## 2013-11-21 VITALS — BP 130/82 | HR 72 | Temp 98.0°F | Resp 16 | Ht 65.5 in | Wt 227.0 lb

## 2013-11-21 DIAGNOSIS — E890 Postprocedural hypothyroidism: Secondary | ICD-10-CM

## 2013-11-21 LAB — T4, FREE: FREE T4: 1.27 ng/dL (ref 0.60–1.60)

## 2013-11-21 LAB — TSH: TSH: 1.7 u[IU]/mL (ref 0.35–5.50)

## 2013-11-21 NOTE — Progress Notes (Signed)
Patient ID: Chelsea Warren, female   DOB: August 16, 1941, 73 y.o.   MRN: [id number]   Reason for Appointment:  Hypothyroidism, followup visit    History of Present Illness:   Her hypothyroidism was first diagnosed in 1964 after thyroidectomy  The treatments that the patient has taken include Synthroid 200 until 1/15 and subsequently 224 mcg         She was last seen in 1/15 and since her TSH was high at 6.7 her dose increased. Also at that time she was complaining of feeling tired all the time especially for the previous 2-3 months as well as weight gain and night sweats Since her dosage change she has not had as much fatigue.  Compliance with the medical regimen has been as prescribed with taking the tablet in the morning before breakfast. She has been taking brand name Synthroid consistently   Wt Readings from Last 3 Encounters:  11/21/13 227 lb (102.967 kg)  10/12/13 225 lb 14.4 oz (102.468 kg)  06/06/13 215 lb 9.6 oz (97.796 kg)   Lab Results  Component Value Date   TSH 3.57 06/06/2013   TSH 1.380 04/16/2010       Medication List       This list is accurate as of: 11/21/13  2:30 PM.  Always use your most recent med list.               ALPRAZolam 0.5 MG tablet  Commonly known as:  XANAX  Take 0.5 mg by mouth 3 (three) times daily as needed for sleep.     aspirin 81 MG tablet  Take 81 mg by mouth daily.     diltiazem 180 MG 24 hr capsule  Commonly known as:  DILACOR XR  Take 180 mg by mouth daily.     lisinopril-hydrochlorothiazide 20-12.5 MG per tablet  Commonly known as:  PRINZIDE,ZESTORETIC  Take 1 tablet by mouth daily.     nitroGLYCERIN 0.6 MG SL tablet  Commonly known as:  NITROSTAT  Place 0.6 mg under the tongue every 5 (five) minutes as needed for chest pain.     SYNTHROID 112 MCG tablet  Generic drug:  levothyroxine  Take 2 tablets (224 mcg total) by mouth daily before breakfast.     warfarin 5 MG tablet  Commonly known as:  COUMADIN  Take 5 mg by  mouth daily. Take 4 mg on fridays and 5 mg the rest of the days        No past medical history on file.  No past surgical history on file.  No family history on file.  Social History:  reports that she has never smoked. She has never used smokeless tobacco. Her alcohol and drug histories are not on file.  Allergies:  Allergies  Allergen Reactions  . Lidocaine     REVIEW of systems:  She has had a history of palpitations and cardiac arrhythmias   She had been previously complaining of significant night sweats and hot flushes but these are somewhat better and she is reluctant to take medications like clonidine.  Hypertension: Appears well controlled with Zestoretic  She is on Coumadin secondary to her cardiac valve procedure    Examination:   BP 130/82  Pulse 72  Temp(Src) 98 F (36.7 C)  Resp 16  Ht 5' 5.5" (1.664 m)  Wt 227 lb (102.967 kg)  BMI 37.19 kg/m2  SpO2 96%       Assessments    Hypothyroidism, post surgical and long-standing She  is having  less fatigue now with increasing her dose by 24 mcg about 6 weeks ago She is still concerned about her weight gain but discussed that this is unrelated to her minor fluctuations and thyroid levels She is having night sweats/hot flashes, recently somewhat better and she does not desire any treatment  PLAN: Check thyroid levels and decide on dosage Followup in 4 months Continue brand name Synthroid  Commodore Bellew 11/21/2013, 2:30 PM

## 2013-11-22 DIAGNOSIS — Z7901 Long term (current) use of anticoagulants: Secondary | ICD-10-CM | POA: Diagnosis not present

## 2013-11-22 NOTE — Progress Notes (Signed)
Quick Note:  Please let patient know that the lab result is very normal and no change needed ______

## 2013-11-23 ENCOUNTER — Telehealth: Payer: Self-pay | Admitting: *Deleted

## 2013-11-23 ENCOUNTER — Other Ambulatory Visit: Payer: Medicare Other

## 2013-11-23 ENCOUNTER — Ambulatory Visit: Payer: Medicare Other | Admitting: Endocrinology

## 2013-11-23 NOTE — Telephone Encounter (Signed)
Labs mailed

## 2013-11-27 DIAGNOSIS — J45909 Unspecified asthma, uncomplicated: Secondary | ICD-10-CM | POA: Diagnosis not present

## 2013-11-27 DIAGNOSIS — N72 Inflammatory disease of cervix uteri: Secondary | ICD-10-CM | POA: Diagnosis not present

## 2013-11-27 DIAGNOSIS — J029 Acute pharyngitis, unspecified: Secondary | ICD-10-CM | POA: Diagnosis not present

## 2013-11-27 DIAGNOSIS — J309 Allergic rhinitis, unspecified: Secondary | ICD-10-CM | POA: Diagnosis not present

## 2013-12-04 ENCOUNTER — Other Ambulatory Visit: Payer: Self-pay | Admitting: *Deleted

## 2013-12-04 ENCOUNTER — Telehealth: Payer: Self-pay | Admitting: Endocrinology

## 2013-12-04 MED ORDER — SYNTHROID 112 MCG PO TABS: 224.0000 ug | ORAL_TABLET | Freq: Every day | ORAL | Status: DC

## 2013-12-04 NOTE — Telephone Encounter (Signed)
Rx sent for name brand synthroid, patient aware

## 2013-12-04 NOTE — Telephone Encounter (Signed)
Pt would like nurse to call her in regards to her Rx Synthroid She states that [facility] will not fill it for her due to the brand name   Please call pt @ [phone]  Thank You :)

## 2013-12-06 ENCOUNTER — Ambulatory Visit: Payer: Medicare Other | Admitting: Endocrinology

## 2013-12-06 ENCOUNTER — Other Ambulatory Visit: Payer: Medicare Other

## 2013-12-06 DIAGNOSIS — Z7901 Long term (current) use of anticoagulants: Secondary | ICD-10-CM | POA: Diagnosis not present

## 2013-12-13 DIAGNOSIS — Z7901 Long term (current) use of anticoagulants: Secondary | ICD-10-CM | POA: Diagnosis not present

## 2013-12-26 DIAGNOSIS — R791 Abnormal coagulation profile: Secondary | ICD-10-CM | POA: Diagnosis not present

## 2014-01-09 DIAGNOSIS — R791 Abnormal coagulation profile: Secondary | ICD-10-CM | POA: Diagnosis not present

## 2014-01-23 DIAGNOSIS — J309 Allergic rhinitis, unspecified: Secondary | ICD-10-CM | POA: Diagnosis not present

## 2014-01-23 DIAGNOSIS — I4891 Unspecified atrial fibrillation: Secondary | ICD-10-CM | POA: Diagnosis not present

## 2014-01-23 DIAGNOSIS — R791 Abnormal coagulation profile: Secondary | ICD-10-CM | POA: Diagnosis not present

## 2014-01-23 DIAGNOSIS — Z79899 Other long term (current) drug therapy: Secondary | ICD-10-CM | POA: Diagnosis not present

## 2014-01-23 DIAGNOSIS — E559 Vitamin D deficiency, unspecified: Secondary | ICD-10-CM | POA: Diagnosis not present

## 2014-01-23 DIAGNOSIS — E782 Mixed hyperlipidemia: Secondary | ICD-10-CM | POA: Diagnosis not present

## 2014-01-23 DIAGNOSIS — N390 Urinary tract infection, site not specified: Secondary | ICD-10-CM | POA: Diagnosis not present

## 2014-01-23 DIAGNOSIS — I10 Essential (primary) hypertension: Secondary | ICD-10-CM | POA: Diagnosis not present

## 2014-01-30 DIAGNOSIS — I4891 Unspecified atrial fibrillation: Secondary | ICD-10-CM | POA: Diagnosis not present

## 2014-01-31 DIAGNOSIS — I4891 Unspecified atrial fibrillation: Secondary | ICD-10-CM | POA: Diagnosis not present

## 2014-01-31 DIAGNOSIS — I471 Supraventricular tachycardia: Secondary | ICD-10-CM | POA: Diagnosis not present

## 2014-02-02 DIAGNOSIS — R791 Abnormal coagulation profile: Secondary | ICD-10-CM | POA: Diagnosis not present

## 2014-02-05 DIAGNOSIS — I10 Essential (primary) hypertension: Secondary | ICD-10-CM | POA: Diagnosis not present

## 2014-02-05 DIAGNOSIS — N390 Urinary tract infection, site not specified: Secondary | ICD-10-CM | POA: Diagnosis not present

## 2014-02-05 DIAGNOSIS — I059 Rheumatic mitral valve disease, unspecified: Secondary | ICD-10-CM | POA: Diagnosis not present

## 2014-02-05 DIAGNOSIS — Z7901 Long term (current) use of anticoagulants: Secondary | ICD-10-CM | POA: Diagnosis not present

## 2014-02-05 DIAGNOSIS — I429 Cardiomyopathy, unspecified: Secondary | ICD-10-CM | POA: Diagnosis not present

## 2014-02-05 DIAGNOSIS — I4891 Unspecified atrial fibrillation: Secondary | ICD-10-CM | POA: Diagnosis not present

## 2014-02-05 DIAGNOSIS — E785 Hyperlipidemia, unspecified: Secondary | ICD-10-CM | POA: Diagnosis not present

## 2014-02-19 DIAGNOSIS — R791 Abnormal coagulation profile: Secondary | ICD-10-CM | POA: Diagnosis not present

## 2014-02-26 DIAGNOSIS — R791 Abnormal coagulation profile: Secondary | ICD-10-CM | POA: Diagnosis not present

## 2014-03-12 DIAGNOSIS — R791 Abnormal coagulation profile: Secondary | ICD-10-CM | POA: Diagnosis not present

## 2014-03-26 ENCOUNTER — Ambulatory Visit (INDEPENDENT_AMBULATORY_CARE_PROVIDER_SITE_OTHER): Payer: Medicare Other | Admitting: Endocrinology

## 2014-03-26 ENCOUNTER — Encounter: Payer: Self-pay | Admitting: Endocrinology

## 2014-03-26 ENCOUNTER — Other Ambulatory Visit (INDEPENDENT_AMBULATORY_CARE_PROVIDER_SITE_OTHER): Payer: Medicare Other

## 2014-03-26 VITALS — BP 130/72 | HR 71 | Temp 98.3°F | Resp 14 | Ht 65.5 in | Wt 225.8 lb

## 2014-03-26 DIAGNOSIS — E890 Postprocedural hypothyroidism: Secondary | ICD-10-CM

## 2014-03-26 DIAGNOSIS — I10 Essential (primary) hypertension: Secondary | ICD-10-CM | POA: Diagnosis not present

## 2014-03-26 LAB — TSH: TSH: 0.34 u[IU]/mL — ABNORMAL LOW (ref 0.35–4.50)

## 2014-03-26 LAB — T4, FREE: FREE T4: 1.62 ng/dL — AB (ref 0.60–1.60)

## 2014-03-26 NOTE — Progress Notes (Signed)
Quick Note:  Thyroid level is slightly high, will need to change prescription but once a week take only 1 tablet of Synthroid 112 mcg and continue 2 tablets on the other days, followup in 4 months with labs on the same day ______

## 2014-03-26 NOTE — Progress Notes (Signed)
Patient ID: Chelsea Warren, female   DOB: [birth year], 73 y.o.   MRN: [id number]   Reason for Appointment:  Hypothyroidism, followup visit    History of Present Illness:   Her hypothyroidism was first diagnosed in 1964 after thyroidectomy  The treatments that the patient has taken include Synthroid 200 until 1/15 and subsequently 224 mcg         She was last seen in 3/15 and TSH was normal after increasing her dose in 1/15 She also has had less fatigue since then. No cold intolerance or change in her chronic dry skin However she does not always feel good and wants her thyroid checked again No recent weight change  Compliance with the medical regimen has been as prescribed with taking the tablet in the morning before breakfast. She has been taking brand name Synthroid consistently   Wt Readings from Last 3 Encounters:  03/26/14 225 lb 12.8 oz (102.422 kg)  11/21/13 227 lb (102.967 kg)  10/12/13 225 lb 14.4 oz (102.468 kg)   Lab Results  Component Value Date   TSH 1.70 11/21/2013   TSH 3.57 06/06/2013   TSH 1.380 04/16/2010       Medication List       This list is accurate as of: 03/26/14  1:52 PM.  Always use your most recent med list.               ALPRAZolam 0.5 MG tablet  Commonly known as:  XANAX  Take 0.5 mg by mouth 3 (three) times daily as needed for sleep.     aspirin 81 MG tablet  Take 81 mg by mouth daily.     diltiazem 180 MG 24 hr capsule  Commonly known as:  DILACOR XR  Take 180 mg by mouth daily.     lisinopril-hydrochlorothiazide 20-12.5 MG per tablet  Commonly known as:  PRINZIDE,ZESTORETIC  Take 1 tablet by mouth daily.     losartan 25 MG tablet  Commonly known as:  COZAAR  Take 25 mg by mouth 2 (two) times daily.     nitroGLYCERIN 0.6 MG SL tablet  Commonly known as:  NITROSTAT  Place 0.6 mg under the tongue every 5 (five) minutes as needed for chest pain.     SYNTHROID 112 MCG tablet  Generic drug:  levothyroxine  Take 2 tablets (224 mcg  total) by mouth daily before breakfast.     warfarin 5 MG tablet  Commonly known as:  COUMADIN  Take 5 mg by mouth daily. Take 4 mg on fridays and 5 mg the rest of the days        No past medical history on file.  Past Surgical History  Procedure Laterality Date  . Total thyroidectomy  1964    No family history on file.  Social History:  reports that she has never smoked. She has never used smokeless tobacco. Her alcohol and drug histories are not on file.  Allergies:  Allergies  Allergen Reactions  . Lidocaine     REVIEW of systems:  She has had a history of palpitations and cardiac arrhythmias   She had been previously complaining of significant night sweats and hot flushes but these are somewhat better   Hypertension: Appears well controlled with Zestoretic  She is on Coumadin secondary to her cardiac valve procedure  Occasional vertigo when bending down   Examination:   BP 130/72  Pulse 71  Temp(Src) 98.3 F (36.8 C)  Resp 14  Ht 5' 5.5" (  1.664 m)  Wt 225 lb 12.8 oz (102.422 kg)  BMI 36.99 kg/m2  SpO2 95%  Thyroid not palpable No tremor Biceps reflexes normal      Assessment  Hypothyroidism, post surgical and long-standing She is having  less fatigue now with increasing her dose to 224 mcg earlier in 2015 She does have chronic nonspecific tiredness and needs periodic monitoring of her thyroid level  PLAN: Check thyroid levels and decide on dosage Followup in 4 months Continue brand name Synthroid  Helen Cuff 03/26/2014, 1:52 PM    Addendum leave off one tablet a week of Synthroid  Lab Results  Component Value Date   TSH 0.34* 03/26/2014

## 2014-03-27 DIAGNOSIS — J309 Allergic rhinitis, unspecified: Secondary | ICD-10-CM | POA: Diagnosis not present

## 2014-03-27 DIAGNOSIS — K219 Gastro-esophageal reflux disease without esophagitis: Secondary | ICD-10-CM | POA: Diagnosis not present

## 2014-03-27 DIAGNOSIS — G43909 Migraine, unspecified, not intractable, without status migrainosus: Secondary | ICD-10-CM | POA: Diagnosis not present

## 2014-03-27 DIAGNOSIS — Z7901 Long term (current) use of anticoagulants: Secondary | ICD-10-CM | POA: Diagnosis not present

## 2014-03-27 DIAGNOSIS — R42 Dizziness and giddiness: Secondary | ICD-10-CM | POA: Diagnosis not present

## 2014-04-10 DIAGNOSIS — R791 Abnormal coagulation profile: Secondary | ICD-10-CM | POA: Diagnosis not present

## 2014-04-26 ENCOUNTER — Other Ambulatory Visit: Payer: Self-pay | Admitting: Endocrinology

## 2014-04-26 DIAGNOSIS — R791 Abnormal coagulation profile: Secondary | ICD-10-CM | POA: Diagnosis not present

## 2014-04-26 DIAGNOSIS — I4891 Unspecified atrial fibrillation: Secondary | ICD-10-CM | POA: Diagnosis not present

## 2014-04-26 DIAGNOSIS — Z0000 Encounter for general adult medical examination without abnormal findings: Secondary | ICD-10-CM | POA: Diagnosis not present

## 2014-04-26 DIAGNOSIS — Z79899 Other long term (current) drug therapy: Secondary | ICD-10-CM | POA: Diagnosis not present

## 2014-04-26 DIAGNOSIS — N390 Urinary tract infection, site not specified: Secondary | ICD-10-CM | POA: Diagnosis not present

## 2014-05-09 DIAGNOSIS — R300 Dysuria: Secondary | ICD-10-CM | POA: Diagnosis not present

## 2014-05-09 DIAGNOSIS — R1032 Left lower quadrant pain: Secondary | ICD-10-CM | POA: Diagnosis not present

## 2014-05-09 DIAGNOSIS — I4891 Unspecified atrial fibrillation: Secondary | ICD-10-CM | POA: Diagnosis not present

## 2014-05-09 DIAGNOSIS — R791 Abnormal coagulation profile: Secondary | ICD-10-CM | POA: Diagnosis not present

## 2014-05-10 DIAGNOSIS — R300 Dysuria: Secondary | ICD-10-CM | POA: Diagnosis not present

## 2014-05-15 DIAGNOSIS — H2510 Age-related nuclear cataract, unspecified eye: Secondary | ICD-10-CM | POA: Diagnosis not present

## 2014-05-16 DIAGNOSIS — R791 Abnormal coagulation profile: Secondary | ICD-10-CM | POA: Diagnosis not present

## 2014-05-29 DIAGNOSIS — Z780 Asymptomatic menopausal state: Secondary | ICD-10-CM | POA: Diagnosis not present

## 2014-05-29 DIAGNOSIS — R1032 Left lower quadrant pain: Secondary | ICD-10-CM | POA: Diagnosis not present

## 2014-05-29 DIAGNOSIS — R791 Abnormal coagulation profile: Secondary | ICD-10-CM | POA: Diagnosis not present

## 2014-06-06 DIAGNOSIS — R079 Chest pain, unspecified: Secondary | ICD-10-CM | POA: Diagnosis not present

## 2014-06-06 DIAGNOSIS — H2510 Age-related nuclear cataract, unspecified eye: Secondary | ICD-10-CM | POA: Diagnosis not present

## 2014-06-12 DIAGNOSIS — R791 Abnormal coagulation profile: Secondary | ICD-10-CM | POA: Diagnosis not present

## 2014-06-20 DIAGNOSIS — R42 Dizziness and giddiness: Secondary | ICD-10-CM | POA: Diagnosis not present

## 2014-06-20 DIAGNOSIS — Z7901 Long term (current) use of anticoagulants: Secondary | ICD-10-CM | POA: Diagnosis not present

## 2014-06-20 DIAGNOSIS — Z6836 Body mass index (BMI) 36.0-36.9, adult: Secondary | ICD-10-CM | POA: Diagnosis not present

## 2014-06-20 DIAGNOSIS — H68009 Unspecified Eustachian salpingitis, unspecified ear: Secondary | ICD-10-CM | POA: Diagnosis not present

## 2014-06-20 DIAGNOSIS — J309 Allergic rhinitis, unspecified: Secondary | ICD-10-CM | POA: Diagnosis not present

## 2014-07-02 DIAGNOSIS — Z7901 Long term (current) use of anticoagulants: Secondary | ICD-10-CM | POA: Diagnosis not present

## 2014-07-04 DIAGNOSIS — H2511 Age-related nuclear cataract, right eye: Secondary | ICD-10-CM | POA: Diagnosis not present

## 2014-07-04 DIAGNOSIS — H25811 Combined forms of age-related cataract, right eye: Secondary | ICD-10-CM | POA: Diagnosis not present

## 2014-07-04 DIAGNOSIS — H2512 Age-related nuclear cataract, left eye: Secondary | ICD-10-CM | POA: Diagnosis not present

## 2014-07-07 ENCOUNTER — Other Ambulatory Visit: Payer: Self-pay | Admitting: Endocrinology

## 2014-07-10 DIAGNOSIS — I10 Essential (primary) hypertension: Secondary | ICD-10-CM | POA: Diagnosis not present

## 2014-07-10 DIAGNOSIS — I252 Old myocardial infarction: Secondary | ICD-10-CM | POA: Diagnosis not present

## 2014-07-10 DIAGNOSIS — H2512 Age-related nuclear cataract, left eye: Secondary | ICD-10-CM | POA: Diagnosis not present

## 2014-07-10 DIAGNOSIS — I429 Cardiomyopathy, unspecified: Secondary | ICD-10-CM | POA: Diagnosis not present

## 2014-07-10 DIAGNOSIS — I4891 Unspecified atrial fibrillation: Secondary | ICD-10-CM | POA: Diagnosis not present

## 2014-07-10 DIAGNOSIS — E785 Hyperlipidemia, unspecified: Secondary | ICD-10-CM | POA: Diagnosis not present

## 2014-07-10 DIAGNOSIS — I2510 Atherosclerotic heart disease of native coronary artery without angina pectoris: Secondary | ICD-10-CM | POA: Diagnosis not present

## 2014-07-10 DIAGNOSIS — H5212 Myopia, left eye: Secondary | ICD-10-CM | POA: Diagnosis not present

## 2014-07-12 NOTE — Telephone Encounter (Signed)
error 

## 2014-07-20 ENCOUNTER — Ambulatory Visit: Payer: Medicare Other | Admitting: Endocrinology

## 2014-07-20 ENCOUNTER — Other Ambulatory Visit: Payer: Medicare Other

## 2014-07-24 DIAGNOSIS — Z7901 Long term (current) use of anticoagulants: Secondary | ICD-10-CM | POA: Diagnosis not present

## 2014-07-25 DIAGNOSIS — H2512 Age-related nuclear cataract, left eye: Secondary | ICD-10-CM | POA: Diagnosis not present

## 2014-07-25 DIAGNOSIS — H25812 Combined forms of age-related cataract, left eye: Secondary | ICD-10-CM | POA: Diagnosis not present

## 2014-07-26 ENCOUNTER — Encounter: Payer: Self-pay | Admitting: Endocrinology

## 2014-07-26 ENCOUNTER — Other Ambulatory Visit (INDEPENDENT_AMBULATORY_CARE_PROVIDER_SITE_OTHER): Payer: Medicare Other

## 2014-07-26 ENCOUNTER — Ambulatory Visit (INDEPENDENT_AMBULATORY_CARE_PROVIDER_SITE_OTHER): Payer: Medicare Other | Admitting: Endocrinology

## 2014-07-26 VITALS — BP 144/88 | HR 66 | Temp 98.2°F | Resp 14 | Ht 65.5 in | Wt 225.8 lb

## 2014-07-26 DIAGNOSIS — E890 Postprocedural hypothyroidism: Secondary | ICD-10-CM

## 2014-07-26 DIAGNOSIS — E785 Hyperlipidemia, unspecified: Secondary | ICD-10-CM

## 2014-07-26 LAB — TSH: TSH: 2.16 u[IU]/mL (ref 0.35–4.50)

## 2014-07-26 LAB — LIPID PANEL
CHOL/HDL RATIO: 5
Cholesterol: 226 mg/dL — ABNORMAL HIGH (ref 0–200)
HDL: 47.3 mg/dL (ref >39.00)
LDL CALC: 152 mg/dL — AB (ref 0–99)
NONHDL: 178.7
TRIGLYCERIDES: 132 mg/dL (ref 0.0–149.0)
VLDL: 26.4 mg/dL (ref 0.0–40.0)

## 2014-07-26 LAB — T4, FREE: Free T4: 1.24 ng/dL (ref 0.60–1.60)

## 2014-07-26 NOTE — Progress Notes (Signed)
Patient ID: Chelsea Warren, female   DOB: Jun 22, 1941, 73 y.o.   MRN: [id number]   Reason for Appointment:  Hypothyroidism, followup visit    History of Present Illness:   Her hypothyroidism was first diagnosed in 1964 after thyroidectomy  The treatments that the patient has taken include Synthroid 200 until 1/15 and subsequently 224 mcg         She was last seen in 03/2014 and TSH was mildly decreased possibly because of increasing the dose slightly in 9/15 At that time she was told to take one less tablet per week of her 112 g but she forgot and continues to take 2 tablets daily No complaints of palpitations or shakiness She does not complain of any hot flashes at this time  She continues to have some chronic fatigue/low energy level. No cold intolerance or change in her chronic dry skin No recent weight change She has numerous other unrelated physical symptoms  Compliance with the medical regimen has been as prescribed with taking the tablet in the morning before breakfast. She has been taking brand name Synthroid consistently   Wt Readings from Last 3 Encounters:  07/26/14 225 lb 12.8 oz (102.422 kg)  03/26/14 225 lb 12.8 oz (102.422 kg)  11/21/13 227 lb (102.967 kg)   Lab Results  Component Value Date   TSH 2.16 07/26/2014   TSH 0.34* 03/26/2014   TSH 1.70 11/21/2013   TSH 3.57 06/06/2013       Medication List       This list is accurate as of: 07/26/14 11:59 PM.  Always use your most recent med list.               ALPRAZolam 0.5 MG tablet  Commonly known as:  XANAX  Take 0.5 mg by mouth 3 (three) times daily as needed for sleep.     aspirin 81 MG tablet  Take 81 mg by mouth daily.     diltiazem 180 MG 24 hr capsule  Commonly known as:  DILACOR XR  Take 180 mg by mouth daily.     fluticasone 50 MCG/ACT nasal spray  Commonly known as:  FLONASE     lisinopril-hydrochlorothiazide 20-12.5 MG per tablet  Commonly known as:  PRINZIDE,ZESTORETIC  Take 1  tablet by mouth daily.     losartan 25 MG tablet  Commonly known as:  COZAAR  Take 25 mg by mouth 2 (two) times daily. Takes 1 in am and 1/2 in pm     meclizine 12.5 MG tablet  Commonly known as:  ANTIVERT     nitroGLYCERIN 0.6 MG SL tablet  Commonly known as:  NITROSTAT  Place 0.6 mg under the tongue every 5 (five) minutes as needed for chest pain.     prednisoLONE acetate 1 % ophthalmic suspension  Commonly known as:  PRED FORTE     PROLENSA 0.07 % Soln  Generic drug:  Bromfenac Sodium     SYNTHROID 112 MCG tablet  Generic drug:  levothyroxine  TAKE 2 TABLETS DAILY BEFORE BREAKFAST     warfarin 5 MG tablet  Commonly known as:  COUMADIN  Take 5 mg by mouth daily. Take 4 mg on fridays and 5 mg the rest of the days        No past medical history on file.  Past Surgical History  Procedure Laterality Date  . Total thyroidectomy  1964    No family history on file.  Social History:  reports that she has never  smoked. She has never used smokeless tobacco. Her alcohol and drug histories are not on file.  Allergies:  Allergies  Allergen Reactions  . Antihistamines, Chlorpheniramine-Type   . Codeine   . Lidocaine   . Morphine And Related     REVIEW of systems:  She has had a history of palpitations and cardiac arrhythmias   She had been previously complaining of significant night sweats and hot flushes    Hypertension: Appears well controlled with Zestoretic  She is on Coumadin secondary to her cardiac valve procedure    Examination:   BP 144/88 mmHg  Pulse 66  Temp(Src) 98.2 F (36.8 C)  Resp 14  Ht 5' 5.5" (1.664 m)  Wt 225 lb 12.8 oz (102.422 kg)  BMI 36.99 kg/m2  SpO2 95%  Thyroid not palpable No tremor Biceps reflexes normal      Assessment  Hypothyroidism, post surgical and long-standing She is here for follow-up of her thyroid levels. She did not reduce her dose as directed when her TSH was low in July Difficult to objectively assess her  symptoms that she has other nonspecific symptoms and problems She has been compliant with her thyroid supplement  History of hypercholesterolemia: she wants a repeat test, is fasting today  PLAN: Check thyroid levels and decide on dosage Followup in 6 months Continue brand name Synthroid  [name] 07/27/2014, 9:05 AM    Addendum: TSH normal, continue same dose  Lab Results  Component Value Date   TSH 2.16 07/26/2014

## 2014-07-27 NOTE — Progress Notes (Signed)
Quick Note:  Please let patient know that the lab result is normal and no change in medication needed, continue 2 pills daily, cholesterol is high at 226, fax to PCP ______

## 2014-07-30 ENCOUNTER — Telehealth: Payer: Self-pay | Admitting: Endocrinology

## 2014-07-30 NOTE — Telephone Encounter (Signed)
Patient would like two copies of her lab results mailed to her.

## 2014-08-07 DIAGNOSIS — Z7901 Long term (current) use of anticoagulants: Secondary | ICD-10-CM | POA: Diagnosis not present

## 2014-08-09 DIAGNOSIS — I2510 Atherosclerotic heart disease of native coronary artery without angina pectoris: Secondary | ICD-10-CM | POA: Diagnosis not present

## 2014-08-09 DIAGNOSIS — I5022 Chronic systolic (congestive) heart failure: Secondary | ICD-10-CM | POA: Diagnosis not present

## 2014-08-14 DIAGNOSIS — Z9181 History of falling: Secondary | ICD-10-CM | POA: Diagnosis not present

## 2014-08-14 DIAGNOSIS — E782 Mixed hyperlipidemia: Secondary | ICD-10-CM | POA: Diagnosis not present

## 2014-08-14 DIAGNOSIS — E039 Hypothyroidism, unspecified: Secondary | ICD-10-CM | POA: Diagnosis not present

## 2014-08-14 DIAGNOSIS — Z1389 Encounter for screening for other disorder: Secondary | ICD-10-CM | POA: Diagnosis not present

## 2014-08-14 DIAGNOSIS — I10 Essential (primary) hypertension: Secondary | ICD-10-CM | POA: Diagnosis not present

## 2014-08-14 DIAGNOSIS — R791 Abnormal coagulation profile: Secondary | ICD-10-CM | POA: Diagnosis not present

## 2014-08-14 DIAGNOSIS — K219 Gastro-esophageal reflux disease without esophagitis: Secondary | ICD-10-CM | POA: Diagnosis not present

## 2014-08-14 DIAGNOSIS — E559 Vitamin D deficiency, unspecified: Secondary | ICD-10-CM | POA: Diagnosis not present

## 2014-08-14 DIAGNOSIS — Z79899 Other long term (current) drug therapy: Secondary | ICD-10-CM | POA: Diagnosis not present

## 2014-08-14 DIAGNOSIS — I4891 Unspecified atrial fibrillation: Secondary | ICD-10-CM | POA: Diagnosis not present

## 2014-08-22 DIAGNOSIS — Z7901 Long term (current) use of anticoagulants: Secondary | ICD-10-CM | POA: Diagnosis not present

## 2014-08-31 DIAGNOSIS — R791 Abnormal coagulation profile: Secondary | ICD-10-CM | POA: Diagnosis not present

## 2014-09-07 DIAGNOSIS — N390 Urinary tract infection, site not specified: Secondary | ICD-10-CM | POA: Diagnosis not present

## 2014-09-07 DIAGNOSIS — R791 Abnormal coagulation profile: Secondary | ICD-10-CM | POA: Diagnosis not present

## 2014-09-07 DIAGNOSIS — Z6836 Body mass index (BMI) 36.0-36.9, adult: Secondary | ICD-10-CM | POA: Diagnosis not present

## 2014-09-07 DIAGNOSIS — I4891 Unspecified atrial fibrillation: Secondary | ICD-10-CM | POA: Diagnosis not present

## 2014-09-10 DIAGNOSIS — I10 Essential (primary) hypertension: Secondary | ICD-10-CM | POA: Diagnosis not present

## 2014-09-10 DIAGNOSIS — E785 Hyperlipidemia, unspecified: Secondary | ICD-10-CM | POA: Diagnosis not present

## 2014-09-10 DIAGNOSIS — I480 Paroxysmal atrial fibrillation: Secondary | ICD-10-CM | POA: Diagnosis not present

## 2014-09-17 DIAGNOSIS — R791 Abnormal coagulation profile: Secondary | ICD-10-CM | POA: Diagnosis not present

## 2014-09-19 ENCOUNTER — Other Ambulatory Visit: Payer: Self-pay | Admitting: Endocrinology

## 2014-09-26 ENCOUNTER — Other Ambulatory Visit: Payer: Medicare Other

## 2014-09-26 ENCOUNTER — Ambulatory Visit: Payer: Medicare Other | Admitting: Endocrinology

## 2014-10-22 DIAGNOSIS — Z7901 Long term (current) use of anticoagulants: Secondary | ICD-10-CM | POA: Diagnosis not present

## 2014-10-25 ENCOUNTER — Encounter: Payer: Self-pay | Admitting: Endocrinology

## 2014-10-25 ENCOUNTER — Ambulatory Visit (INDEPENDENT_AMBULATORY_CARE_PROVIDER_SITE_OTHER): Payer: Medicare Other | Admitting: Endocrinology

## 2014-10-25 VITALS — BP 176/99 | HR 65 | Temp 98.0°F | Resp 16 | Ht 65.5 in | Wt 226.4 lb

## 2014-10-25 DIAGNOSIS — E890 Postprocedural hypothyroidism: Secondary | ICD-10-CM

## 2014-10-25 DIAGNOSIS — I10 Essential (primary) hypertension: Secondary | ICD-10-CM

## 2014-10-25 LAB — TSH: TSH: 1.13 u[IU]/mL (ref 0.35–4.50)

## 2014-10-25 LAB — T4, FREE: Free T4: 1.46 ng/dL (ref 0.60–1.60)

## 2014-10-25 NOTE — Progress Notes (Signed)
Patient ID: Chelsea Warren, female   DOB: [birth year], 74 y.o.   MRN: [id number]   Reason for Appointment:  Hypothyroidism, followup visit    History of Present Illness:   Her hypothyroidism was first diagnosed in 1964 after thyroidectomy  The treatments that the patient has taken include Synthroid 200 until 1/15 and subsequently 224 mcg         She was last seen in 11/15 and TSH was normal At times she does have some fluctuation in her TSH even with the same dose. Currently does not complain of any unusual fatigue She is concerned about her difficulty with losing weight   No cold intolerance or change in her chronic dry skin She wanted to come in early to make sure her thyroid level is consistent.  Compliance with the medical regimen has been as prescribed with taking the tablet in the morning before breakfast.  She has been taking brand name Synthroid consistently   Wt Readings from Last 3 Encounters:  10/25/14 226 lb 6.4 oz (102.694 kg)  07/26/14 225 lb 12.8 oz (102.422 kg)  03/26/14 225 lb 12.8 oz (102.422 kg)   Lab Results  Component Value Date   TSH 1.13 10/25/2014   TSH 2.16 07/26/2014   TSH 0.34* 03/26/2014   TSH 1.70 11/21/2013       Medication List       This list is accurate as of: 10/25/14 11:59 PM.  Always use your most recent med list.               ALPRAZolam 0.5 MG tablet  Commonly known as:  XANAX  Take 0.5 mg by mouth 3 (three) times daily as needed for sleep.     aspirin 81 MG tablet  Take 81 mg by mouth daily.     diltiazem 120 MG tablet  Commonly known as:  CARDIZEM  Take 120 mg by mouth daily.     fluticasone 50 MCG/ACT nasal spray  Commonly known as:  FLONASE     losartan 25 MG tablet  Commonly known as:  COZAAR  Take 25 mg by mouth 2 (two) times daily. Takes 2 tablet in am and 2 tablets in pm     meclizine 12.5 MG tablet  Commonly known as:  ANTIVERT     nitroGLYCERIN 0.6 MG SL tablet  Commonly known as:  NITROSTAT  Place 0.6  mg under the tongue every 5 (five) minutes as needed for chest pain.     prednisoLONE acetate 1 % ophthalmic suspension  Commonly known as:  PRED FORTE     PROLENSA 0.07 % Soln  Generic drug:  Bromfenac Sodium     SYNTHROID 112 MCG tablet  Generic drug:  levothyroxine  TAKE 2 TABLETS DAILY BEFORE BREAKFAST     warfarin 5 MG tablet  Commonly known as:  COUMADIN  Take 5 mg by mouth daily. 5 mg every day except Wednesday, take 5.5 on Wednesday        No past medical history on file.  Past Surgical History  Procedure Laterality Date  . Total thyroidectomy  1964    No family history on file.  Social History:  reports that she has never smoked. She has never used smokeless tobacco. Her alcohol and drug histories are not on file.  Allergies:  Allergies  Allergen Reactions  . Antihistamines, Chlorpheniramine-Type   . Codeine   . Lidocaine   . Morphine And Related      REVIEW of systems:  She has had a history of palpitations and cardiac arrhythmias    Hypertension: she is asking about her medications.  She thinks her blood pressure has been high recently and she is now taking losartan and diltiazem.  She thinks that higher doses of diltiazem makes her short of breath and she could not tolerate HCTZ   She is on Coumadin secondary to her cardiac valve procedure  Hyperlipidemia: She is asking about her previous cholesterol levels.  She apparently has had intolerance to one of the statin drugs and has modestly increased levels   Examination:   BP 176/99 mmHg  Pulse 65  Temp(Src) 98 F (36.7 C)  Resp 16  Ht 5' 5.5" (1.664 m)  Wt 226 lb 6.4 oz (102.694 kg)  BMI 37.09 kg/m2  SpO2 95%  Heart rhythm appears regular  Biceps reflexes normal      Assessment  Hypothyroidism, post surgical and long-standing Currently does not appear to be symptomatic from hypothyroidism and still is taking relatively large doses Very compliant with her medication and will need to have  her thyroid level checked today for consistency  Hypertension: She will follow-up with cardiologist or PCP   [facility] 10/26/2014, 10:08 AM    Addendum: TSH normal, continue same dose  Lab Results  Component Value Date   TSH 1.13 10/25/2014

## 2014-10-26 NOTE — Progress Notes (Signed)
Quick Note:  Please let patient know that the lab result is normal and no further action needed ______ 

## 2014-10-30 ENCOUNTER — Telehealth: Payer: Self-pay | Admitting: Endocrinology

## 2014-10-30 NOTE — Telephone Encounter (Signed)
Patient is calling for the results of her lab results °

## 2014-10-30 NOTE — Telephone Encounter (Signed)
Message already left on her voicemail about normal test

## 2014-11-01 DIAGNOSIS — N390 Urinary tract infection, site not specified: Secondary | ICD-10-CM | POA: Diagnosis not present

## 2014-11-01 DIAGNOSIS — Z7901 Long term (current) use of anticoagulants: Secondary | ICD-10-CM | POA: Diagnosis not present

## 2014-11-01 DIAGNOSIS — K219 Gastro-esophageal reflux disease without esophagitis: Secondary | ICD-10-CM | POA: Diagnosis not present

## 2014-11-01 DIAGNOSIS — J208 Acute bronchitis due to other specified organisms: Secondary | ICD-10-CM | POA: Diagnosis not present

## 2014-11-01 DIAGNOSIS — J329 Chronic sinusitis, unspecified: Secondary | ICD-10-CM | POA: Diagnosis not present

## 2014-11-01 DIAGNOSIS — J029 Acute pharyngitis, unspecified: Secondary | ICD-10-CM | POA: Diagnosis not present

## 2014-11-08 DIAGNOSIS — Z7901 Long term (current) use of anticoagulants: Secondary | ICD-10-CM | POA: Diagnosis not present

## 2014-11-13 DIAGNOSIS — I480 Paroxysmal atrial fibrillation: Secondary | ICD-10-CM | POA: Diagnosis not present

## 2014-11-13 DIAGNOSIS — I10 Essential (primary) hypertension: Secondary | ICD-10-CM | POA: Diagnosis not present

## 2014-11-14 DIAGNOSIS — I10 Essential (primary) hypertension: Secondary | ICD-10-CM | POA: Diagnosis not present

## 2014-11-14 DIAGNOSIS — J309 Allergic rhinitis, unspecified: Secondary | ICD-10-CM | POA: Diagnosis not present

## 2014-11-14 DIAGNOSIS — E785 Hyperlipidemia, unspecified: Secondary | ICD-10-CM | POA: Diagnosis not present

## 2014-11-14 DIAGNOSIS — E559 Vitamin D deficiency, unspecified: Secondary | ICD-10-CM | POA: Diagnosis not present

## 2014-11-14 DIAGNOSIS — I4891 Unspecified atrial fibrillation: Secondary | ICD-10-CM | POA: Diagnosis not present

## 2014-11-14 DIAGNOSIS — E039 Hypothyroidism, unspecified: Secondary | ICD-10-CM | POA: Diagnosis not present

## 2014-11-14 DIAGNOSIS — Z9889 Other specified postprocedural states: Secondary | ICD-10-CM | POA: Diagnosis not present

## 2014-11-14 DIAGNOSIS — Z79899 Other long term (current) drug therapy: Secondary | ICD-10-CM | POA: Diagnosis not present

## 2014-11-14 DIAGNOSIS — K219 Gastro-esophageal reflux disease without esophagitis: Secondary | ICD-10-CM | POA: Diagnosis not present

## 2014-11-14 DIAGNOSIS — R791 Abnormal coagulation profile: Secondary | ICD-10-CM | POA: Diagnosis not present

## 2014-11-14 DIAGNOSIS — I2510 Atherosclerotic heart disease of native coronary artery without angina pectoris: Secondary | ICD-10-CM | POA: Diagnosis not present

## 2014-11-21 DIAGNOSIS — R791 Abnormal coagulation profile: Secondary | ICD-10-CM | POA: Diagnosis not present

## 2014-11-23 DIAGNOSIS — Z7901 Long term (current) use of anticoagulants: Secondary | ICD-10-CM | POA: Diagnosis not present

## 2014-11-23 DIAGNOSIS — E785 Hyperlipidemia, unspecified: Secondary | ICD-10-CM | POA: Diagnosis not present

## 2014-11-23 DIAGNOSIS — R079 Chest pain, unspecified: Secondary | ICD-10-CM | POA: Diagnosis not present

## 2014-11-23 DIAGNOSIS — I10 Essential (primary) hypertension: Secondary | ICD-10-CM | POA: Diagnosis not present

## 2014-11-23 DIAGNOSIS — R002 Palpitations: Secondary | ICD-10-CM | POA: Diagnosis not present

## 2014-11-23 DIAGNOSIS — I4891 Unspecified atrial fibrillation: Secondary | ICD-10-CM | POA: Diagnosis not present

## 2014-11-24 DIAGNOSIS — I10 Essential (primary) hypertension: Secondary | ICD-10-CM | POA: Diagnosis not present

## 2014-11-24 DIAGNOSIS — R002 Palpitations: Secondary | ICD-10-CM | POA: Diagnosis not present

## 2014-11-24 DIAGNOSIS — R000 Tachycardia, unspecified: Secondary | ICD-10-CM | POA: Diagnosis not present

## 2014-11-24 DIAGNOSIS — E785 Hyperlipidemia, unspecified: Secondary | ICD-10-CM | POA: Diagnosis not present

## 2014-11-24 DIAGNOSIS — I480 Paroxysmal atrial fibrillation: Secondary | ICD-10-CM | POA: Diagnosis not present

## 2014-11-24 DIAGNOSIS — I2510 Atherosclerotic heart disease of native coronary artery without angina pectoris: Secondary | ICD-10-CM | POA: Diagnosis present

## 2014-11-24 DIAGNOSIS — R079 Chest pain, unspecified: Secondary | ICD-10-CM | POA: Diagnosis not present

## 2014-11-24 DIAGNOSIS — I4891 Unspecified atrial fibrillation: Secondary | ICD-10-CM | POA: Diagnosis not present

## 2014-11-24 DIAGNOSIS — Z7901 Long term (current) use of anticoagulants: Secondary | ICD-10-CM | POA: Diagnosis not present

## 2014-11-28 DIAGNOSIS — R791 Abnormal coagulation profile: Secondary | ICD-10-CM | POA: Diagnosis not present

## 2014-11-28 DIAGNOSIS — I4891 Unspecified atrial fibrillation: Secondary | ICD-10-CM | POA: Diagnosis not present

## 2014-11-28 DIAGNOSIS — Z09 Encounter for follow-up examination after completed treatment for conditions other than malignant neoplasm: Secondary | ICD-10-CM | POA: Diagnosis not present

## 2014-12-05 DIAGNOSIS — R791 Abnormal coagulation profile: Secondary | ICD-10-CM | POA: Diagnosis not present

## 2014-12-11 DIAGNOSIS — I10 Essential (primary) hypertension: Secondary | ICD-10-CM | POA: Diagnosis not present

## 2014-12-11 DIAGNOSIS — R1030 Lower abdominal pain, unspecified: Secondary | ICD-10-CM | POA: Diagnosis not present

## 2014-12-11 DIAGNOSIS — E785 Hyperlipidemia, unspecified: Secondary | ICD-10-CM | POA: Diagnosis not present

## 2014-12-11 DIAGNOSIS — I480 Paroxysmal atrial fibrillation: Secondary | ICD-10-CM | POA: Diagnosis not present

## 2014-12-12 DIAGNOSIS — R791 Abnormal coagulation profile: Secondary | ICD-10-CM | POA: Diagnosis not present

## 2014-12-19 DIAGNOSIS — R791 Abnormal coagulation profile: Secondary | ICD-10-CM | POA: Diagnosis not present

## 2014-12-25 DIAGNOSIS — R791 Abnormal coagulation profile: Secondary | ICD-10-CM | POA: Diagnosis not present

## 2015-01-08 DIAGNOSIS — Z7901 Long term (current) use of anticoagulants: Secondary | ICD-10-CM | POA: Diagnosis not present

## 2015-01-14 DIAGNOSIS — J019 Acute sinusitis, unspecified: Secondary | ICD-10-CM | POA: Diagnosis not present

## 2015-01-14 DIAGNOSIS — H68009 Unspecified Eustachian salpingitis, unspecified ear: Secondary | ICD-10-CM | POA: Diagnosis not present

## 2015-01-14 DIAGNOSIS — J209 Acute bronchitis, unspecified: Secondary | ICD-10-CM | POA: Diagnosis not present

## 2015-01-14 DIAGNOSIS — Z7901 Long term (current) use of anticoagulants: Secondary | ICD-10-CM | POA: Diagnosis not present

## 2015-01-14 DIAGNOSIS — Z6836 Body mass index (BMI) 36.0-36.9, adult: Secondary | ICD-10-CM | POA: Diagnosis not present

## 2015-01-14 DIAGNOSIS — J029 Acute pharyngitis, unspecified: Secondary | ICD-10-CM | POA: Diagnosis not present

## 2015-01-18 DIAGNOSIS — I480 Paroxysmal atrial fibrillation: Secondary | ICD-10-CM | POA: Diagnosis not present

## 2015-01-18 DIAGNOSIS — Z952 Presence of prosthetic heart valve: Secondary | ICD-10-CM | POA: Diagnosis not present

## 2015-01-18 DIAGNOSIS — I483 Typical atrial flutter: Secondary | ICD-10-CM | POA: Diagnosis not present

## 2015-01-22 DIAGNOSIS — Z6836 Body mass index (BMI) 36.0-36.9, adult: Secondary | ICD-10-CM | POA: Diagnosis not present

## 2015-01-22 DIAGNOSIS — R791 Abnormal coagulation profile: Secondary | ICD-10-CM | POA: Diagnosis not present

## 2015-01-22 DIAGNOSIS — E559 Vitamin D deficiency, unspecified: Secondary | ICD-10-CM | POA: Diagnosis not present

## 2015-01-22 DIAGNOSIS — I340 Nonrheumatic mitral (valve) insufficiency: Secondary | ICD-10-CM | POA: Diagnosis not present

## 2015-01-22 DIAGNOSIS — I10 Essential (primary) hypertension: Secondary | ICD-10-CM | POA: Diagnosis not present

## 2015-01-22 DIAGNOSIS — J309 Allergic rhinitis, unspecified: Secondary | ICD-10-CM | POA: Diagnosis not present

## 2015-01-22 DIAGNOSIS — H68009 Unspecified Eustachian salpingitis, unspecified ear: Secondary | ICD-10-CM | POA: Diagnosis not present

## 2015-01-22 DIAGNOSIS — E782 Mixed hyperlipidemia: Secondary | ICD-10-CM | POA: Diagnosis not present

## 2015-01-22 DIAGNOSIS — Z79899 Other long term (current) drug therapy: Secondary | ICD-10-CM | POA: Diagnosis not present

## 2015-02-05 DIAGNOSIS — R791 Abnormal coagulation profile: Secondary | ICD-10-CM | POA: Diagnosis not present

## 2015-02-19 DIAGNOSIS — R791 Abnormal coagulation profile: Secondary | ICD-10-CM | POA: Diagnosis not present

## 2015-02-19 DIAGNOSIS — R300 Dysuria: Secondary | ICD-10-CM | POA: Diagnosis not present

## 2015-02-19 DIAGNOSIS — I4891 Unspecified atrial fibrillation: Secondary | ICD-10-CM | POA: Diagnosis not present

## 2015-02-26 ENCOUNTER — Other Ambulatory Visit: Payer: Self-pay | Admitting: Endocrinology

## 2015-02-28 DIAGNOSIS — H524 Presbyopia: Secondary | ICD-10-CM | POA: Diagnosis not present

## 2015-02-28 DIAGNOSIS — H26493 Other secondary cataract, bilateral: Secondary | ICD-10-CM | POA: Diagnosis not present

## 2015-03-05 DIAGNOSIS — R791 Abnormal coagulation profile: Secondary | ICD-10-CM | POA: Diagnosis not present

## 2015-03-05 DIAGNOSIS — I4891 Unspecified atrial fibrillation: Secondary | ICD-10-CM | POA: Diagnosis not present

## 2015-03-12 DIAGNOSIS — R791 Abnormal coagulation profile: Secondary | ICD-10-CM | POA: Diagnosis not present

## 2015-03-19 DIAGNOSIS — R791 Abnormal coagulation profile: Secondary | ICD-10-CM | POA: Diagnosis not present

## 2015-03-19 DIAGNOSIS — I4891 Unspecified atrial fibrillation: Secondary | ICD-10-CM | POA: Diagnosis not present

## 2015-03-26 DIAGNOSIS — Z7901 Long term (current) use of anticoagulants: Secondary | ICD-10-CM | POA: Diagnosis not present

## 2015-04-02 DIAGNOSIS — R791 Abnormal coagulation profile: Secondary | ICD-10-CM | POA: Diagnosis not present

## 2015-04-09 DIAGNOSIS — R791 Abnormal coagulation profile: Secondary | ICD-10-CM | POA: Diagnosis not present

## 2015-04-18 ENCOUNTER — Other Ambulatory Visit: Payer: Medicare Other

## 2015-04-23 DIAGNOSIS — Z6836 Body mass index (BMI) 36.0-36.9, adult: Secondary | ICD-10-CM | POA: Diagnosis not present

## 2015-04-23 DIAGNOSIS — H68009 Unspecified Eustachian salpingitis, unspecified ear: Secondary | ICD-10-CM | POA: Diagnosis not present

## 2015-04-23 DIAGNOSIS — E559 Vitamin D deficiency, unspecified: Secondary | ICD-10-CM | POA: Diagnosis not present

## 2015-04-23 DIAGNOSIS — F419 Anxiety disorder, unspecified: Secondary | ICD-10-CM | POA: Diagnosis not present

## 2015-04-23 DIAGNOSIS — J309 Allergic rhinitis, unspecified: Secondary | ICD-10-CM | POA: Diagnosis not present

## 2015-04-23 DIAGNOSIS — R21 Rash and other nonspecific skin eruption: Secondary | ICD-10-CM | POA: Diagnosis not present

## 2015-04-23 DIAGNOSIS — R791 Abnormal coagulation profile: Secondary | ICD-10-CM | POA: Diagnosis not present

## 2015-04-23 DIAGNOSIS — I10 Essential (primary) hypertension: Secondary | ICD-10-CM | POA: Diagnosis not present

## 2015-04-25 ENCOUNTER — Ambulatory Visit: Payer: Medicare Other | Admitting: Endocrinology

## 2015-04-25 ENCOUNTER — Other Ambulatory Visit: Payer: Medicare Other

## 2015-04-25 DIAGNOSIS — Z9889 Other specified postprocedural states: Secondary | ICD-10-CM | POA: Insufficient documentation

## 2015-04-25 DIAGNOSIS — I10 Essential (primary) hypertension: Secondary | ICD-10-CM | POA: Diagnosis not present

## 2015-04-25 DIAGNOSIS — I480 Paroxysmal atrial fibrillation: Secondary | ICD-10-CM | POA: Diagnosis not present

## 2015-04-26 ENCOUNTER — Encounter: Payer: Self-pay | Admitting: Endocrinology

## 2015-04-26 ENCOUNTER — Ambulatory Visit (INDEPENDENT_AMBULATORY_CARE_PROVIDER_SITE_OTHER): Payer: Medicare Other | Admitting: Endocrinology

## 2015-04-26 DIAGNOSIS — E890 Postprocedural hypothyroidism: Secondary | ICD-10-CM | POA: Diagnosis not present

## 2015-04-26 LAB — TSH: TSH: 1.12 u[IU]/mL (ref 0.35–4.50)

## 2015-04-26 NOTE — Progress Notes (Signed)
Patient ID: Chelsea Warren, female   DOB: Aug 16, 1941, 74 y.o.   MRN: [id number]   Reason for Appointment:  Hypothyroidism, followup visit    History of Present Illness:   Her hypothyroidism was first diagnosed in 1964 after thyroidectomy  The treatments that the patient has taken include Synthroid 200 until 1/15 and subsequently 224 mcg         She was last seen in 11/15 and TSH was normal At times she does have some fluctuation in her TSH even with the same dose. Currently does not complain of any unusual fatigue She thinks she feels better with changing her cardiac medications Her weight has come down slightly, may be more active  No cold intolerance or change in her chronic dry skin  Compliance with the medical regimen has been as prescribed with taking the tablet in the morning before breakfast.  She has been taking brand name Synthroid consistently   Wt Readings from Last 3 Encounters:  04/26/15 219 lb (99.338 kg)  10/25/14 226 lb 6.4 oz (102.694 kg)  07/26/14 225 lb 12.8 oz (102.422 kg)   Lab Results  Component Value Date   TSH 1.12 04/26/2015   TSH 1.13 10/25/2014   TSH 2.16 07/26/2014   TSH 0.34* 03/26/2014       Medication List       This list is accurate as of: 04/26/15  3:29 PM.  Always use your most recent med list.               ALPRAZolam 0.5 MG tablet  Commonly known as:  XANAX  Take 0.5 mg by mouth 3 (three) times daily as needed for sleep.     dofetilide 125 MCG capsule  Commonly known as:  TIKOSYN  Take 125 mcg by mouth 2 (two) times daily.     fluticasone 50 MCG/ACT nasal spray  Commonly known as:  FLONASE     lisinopril 20 MG tablet  Commonly known as:  PRINIVIL,ZESTRIL  Take 20 mg by mouth daily.     meclizine 12.5 MG tablet  Commonly known as:  ANTIVERT     nitroGLYCERIN 0.6 MG SL tablet  Commonly known as:  NITROSTAT  Place 0.6 mg under the tongue every 5 (five) minutes as needed for chest pain.     prednisoLONE  acetate 1 % ophthalmic suspension  Commonly known as:  PRED FORTE     PROLENSA 0.07 % Soln  Generic drug:  Bromfenac Sodium     SYNTHROID 112 MCG tablet  Generic drug:  levothyroxine  TAKE 2 TABLETS DAILY BEFORE BREAKFAST     warfarin 5 MG tablet  Commonly known as:  COUMADIN  Take 5 mg by mouth daily. 5 mg every day except Wednesday, take 5.5 on Wednesday        No past medical history on file.  Past Surgical History  Procedure Laterality Date  . Total thyroidectomy  1964    No family history on file.  Social History:  reports that she has never smoked. She has never used smokeless tobacco. Her alcohol and drug histories are not on file.  Allergies:  Allergies  Allergen Reactions  . Antihistamines, Chlorpheniramine-Type   . Codeine   . Lidocaine   . Morphine And Related      REVIEW of systems:  She has had a history of palpitations and cardiac arrhythmias now taking dofetilide   Hypertension: she has good control   She is on Coumadin secondary to  her cardiac valve procedure   Examination:   BP 124/82 mmHg  Pulse 56  Temp(Src) 98.1 F (36.7 C)  Resp 16  Ht 5' 5.5" (1.664 m)  Wt 219 lb (99.338 kg)  BMI 35.88 kg/m2  SpO2 95%  Neck exam is normal Biceps reflexes normal      Assessment  Hypothyroidism, post surgical and long-standing Currently she has been feeling fairly good subjectively and looks euthyroid Very compliant with her brand name Synthroid medication and will need to have her thyroid level checked today for consistency  Hypertension: She will follow-up with cardiologist or PCP   [facility] 04/26/2015, 3:29 PM   Addendum: TSH is normal, continue same dose for 6 months Addendum: TSH  normal, continue same dose  Lab Results  Component Value Date   TSH 1.12 04/26/2015   Current

## 2015-04-30 NOTE — Progress Notes (Signed)
Quick Note:  Please let patient know that the lab result is normal and no change needed ______ 

## 2015-05-21 DIAGNOSIS — R791 Abnormal coagulation profile: Secondary | ICD-10-CM | POA: Diagnosis not present

## 2015-05-28 DIAGNOSIS — R791 Abnormal coagulation profile: Secondary | ICD-10-CM | POA: Diagnosis not present

## 2015-06-05 DIAGNOSIS — M545 Low back pain: Secondary | ICD-10-CM | POA: Diagnosis not present

## 2015-06-05 DIAGNOSIS — Z0000 Encounter for general adult medical examination without abnormal findings: Secondary | ICD-10-CM | POA: Diagnosis not present

## 2015-06-05 DIAGNOSIS — Z9181 History of falling: Secondary | ICD-10-CM | POA: Diagnosis not present

## 2015-06-05 DIAGNOSIS — I4891 Unspecified atrial fibrillation: Secondary | ICD-10-CM | POA: Diagnosis not present

## 2015-06-05 DIAGNOSIS — R229 Localized swelling, mass and lump, unspecified: Secondary | ICD-10-CM | POA: Diagnosis not present

## 2015-06-05 DIAGNOSIS — Z6836 Body mass index (BMI) 36.0-36.9, adult: Secondary | ICD-10-CM | POA: Diagnosis not present

## 2015-06-05 DIAGNOSIS — R791 Abnormal coagulation profile: Secondary | ICD-10-CM | POA: Diagnosis not present

## 2015-06-05 DIAGNOSIS — E785 Hyperlipidemia, unspecified: Secondary | ICD-10-CM | POA: Diagnosis not present

## 2015-06-05 DIAGNOSIS — Z79899 Other long term (current) drug therapy: Secondary | ICD-10-CM | POA: Diagnosis not present

## 2015-06-05 DIAGNOSIS — N390 Urinary tract infection, site not specified: Secondary | ICD-10-CM | POA: Diagnosis not present

## 2015-06-12 ENCOUNTER — Telehealth: Payer: Self-pay | Admitting: Endocrinology

## 2015-06-12 DIAGNOSIS — R791 Abnormal coagulation profile: Secondary | ICD-10-CM | POA: Diagnosis not present

## 2015-06-12 NOTE — Telephone Encounter (Signed)
Patient stated the she went to her PCP and got her labs drawn her Tsh was abnormally low, please advise

## 2015-06-13 NOTE — Telephone Encounter (Signed)
Patient understands, needs follow-up in 6 weeks with TSH lab

## 2015-06-13 NOTE — Telephone Encounter (Signed)
Please see below and advise.

## 2015-06-13 NOTE — Telephone Encounter (Signed)
Confirm that she is taking 2 tablets brand name Synthroid 112 g daily.  Needs to reduce the dose by 1 pill once a week and see me back in 6 weeks with repeat TSH

## 2015-06-13 NOTE — Telephone Encounter (Signed)
Patient can't seem to wrap her mind around Dr Dwyane Dee Instructions, she want someone to call her today.

## 2015-06-14 NOTE — Telephone Encounter (Signed)
Noted  

## 2015-06-19 DIAGNOSIS — R791 Abnormal coagulation profile: Secondary | ICD-10-CM | POA: Diagnosis not present

## 2015-06-24 DIAGNOSIS — H524 Presbyopia: Secondary | ICD-10-CM | POA: Diagnosis not present

## 2015-06-24 DIAGNOSIS — H26493 Other secondary cataract, bilateral: Secondary | ICD-10-CM | POA: Diagnosis not present

## 2015-06-26 DIAGNOSIS — E559 Vitamin D deficiency, unspecified: Secondary | ICD-10-CM | POA: Diagnosis not present

## 2015-06-26 DIAGNOSIS — J019 Acute sinusitis, unspecified: Secondary | ICD-10-CM | POA: Diagnosis not present

## 2015-06-26 DIAGNOSIS — J40 Bronchitis, not specified as acute or chronic: Secondary | ICD-10-CM | POA: Diagnosis not present

## 2015-06-26 DIAGNOSIS — J029 Acute pharyngitis, unspecified: Secondary | ICD-10-CM | POA: Diagnosis not present

## 2015-06-26 DIAGNOSIS — Z6836 Body mass index (BMI) 36.0-36.9, adult: Secondary | ICD-10-CM | POA: Diagnosis not present

## 2015-06-26 DIAGNOSIS — R791 Abnormal coagulation profile: Secondary | ICD-10-CM | POA: Diagnosis not present

## 2015-07-03 DIAGNOSIS — R791 Abnormal coagulation profile: Secondary | ICD-10-CM | POA: Diagnosis not present

## 2015-07-10 DIAGNOSIS — R791 Abnormal coagulation profile: Secondary | ICD-10-CM | POA: Diagnosis not present

## 2015-07-17 DIAGNOSIS — R791 Abnormal coagulation profile: Secondary | ICD-10-CM | POA: Diagnosis not present

## 2015-07-24 DIAGNOSIS — R791 Abnormal coagulation profile: Secondary | ICD-10-CM | POA: Diagnosis not present

## 2015-07-26 ENCOUNTER — Encounter: Payer: Self-pay | Admitting: Endocrinology

## 2015-07-26 ENCOUNTER — Ambulatory Visit (INDEPENDENT_AMBULATORY_CARE_PROVIDER_SITE_OTHER): Payer: Medicare Other | Admitting: Endocrinology

## 2015-07-26 VITALS — BP 126/82 | HR 67 | Temp 98.5°F | Resp 16 | Ht 65.5 in | Wt 218.8 lb

## 2015-07-26 DIAGNOSIS — E890 Postprocedural hypothyroidism: Secondary | ICD-10-CM

## 2015-07-26 NOTE — Progress Notes (Signed)
Patient ID: Chelsea Warren, female   DOB: [birth year], 74 y.o.   MRN: [id number]   Reason for Appointment:  Hypothyroidism, followup visit    History of Present Illness:   Her hypothyroidism was first diagnosed in 1964 after thyroidectomy  The treatments that the patient has taken include Synthroid 200 until 1/15 and subsequently 224 mcg         She was last seen in 11/15 and TSH was normal At times she does have some fluctuation in her TSH even with the same dose. Currently does not complain of any unusual fatigue  She thinks she feels better with changing her cardiac medications Her weight has come down slightly, may be more active  No cold intolerance or change in her chronic dry skin  Compliance with the medical regimen has been as prescribed with taking the tablet in the morning before breakfast.   About 6 weeks ago she had TSH done by PCP and it was relatively low, actual report not available for review today; this was despite a normal TSH in 8/16 and no changes being made in her medications or her timing of taking the medication She was told to leave off one of the Synthroid tablets on Friday and take 2 tablets on other days as before She does not feel any different with this change  She has been taking brand name Synthroid consistently   Wt Readings from Last 3 Encounters:  07/26/15 218 lb 12.8 oz (99.247 kg)  04/26/15 219 lb (99.338 kg)  10/25/14 226 lb 6.4 oz (102.694 kg)   Lab Results  Component Value Date   TSH 1.12 04/26/2015   TSH 1.13 10/25/2014   TSH 2.16 07/26/2014   TSH 0.34* 03/26/2014       Medication List       This list is accurate as of: 07/26/15  3:02 PM.  Always use your most recent med list.               ALPRAZolam 0.5 MG tablet  Commonly known as:  XANAX  Take 0.5 mg by mouth 3 (three) times daily as needed for sleep.     dofetilide 125 MCG capsule  Commonly known as:  TIKOSYN  Take 125 mcg by mouth 2 (two) times daily.     fluticasone 50 MCG/ACT nasal spray  Commonly known as:  FLONASE     lisinopril 20 MG tablet  Commonly known as:  PRINIVIL,ZESTRIL  Take 20 mg by mouth daily.     meclizine 12.5 MG tablet  Commonly known as:  ANTIVERT     nitroGLYCERIN 0.6 MG SL tablet  Commonly known as:  NITROSTAT  Place 0.6 mg under the tongue every 5 (five) minutes as needed for chest pain.     prednisoLONE acetate 1 % ophthalmic suspension  Commonly known as:  PRED FORTE     PROLENSA 0.07 % Soln  Generic drug:  Bromfenac Sodium     SYNTHROID 112 MCG tablet  Generic drug:  levothyroxine  TAKE 2 TABLETS DAILY BEFORE BREAKFAST     warfarin 5 MG tablet  Commonly known as:  COUMADIN  Take 5 mg by mouth daily. 5 mg every day except Wednesday, take 5.5 on Wednesday        No past medical history on file.  Past Surgical History  Procedure Laterality Date  . Total thyroidectomy  1964    No family history on file.  Social History:  reports that she has never  smoked. She has never used smokeless tobacco. Her alcohol and drug histories are not on file.  Allergies:  Allergies  Allergen Reactions  . Antihistamines, Chlorpheniramine-Type   . Codeine   . Lidocaine   . Morphine And Related      REVIEW of systems:  She has had a history of palpitations and cardiac arrhythmias now taking dofetilide   Hypertension: she has good control   She is on Coumadin secondary to her cardiac valve procedure   Examination:   BP 126/82 mmHg  Pulse 67  Temp(Src) 98.5 F (36.9 C)  Resp 16  Ht 5' 5.5" (1.664 m)  Wt 218 lb 12.8 oz (99.247 kg)  BMI 35.84 kg/m2  SpO2 96%  Biceps reflexes normal      Assessment  Hypothyroidism, post surgical and long-standing Currently she has been feeling fairly good subjectively and looks euthyroid  Not clear why her TSH was relatively low about 6 weeks ago despite being normal in August She is taking 2 tablets daily of the 112 except on Friday when she takes one  tablet recently She has not had any change in her medication regimen or compliance, change in any supplements or other interacting medications  Will check her TSH and adjust medication accordingly  Chelsea Warren 07/26/2015, 3:02 PM

## 2015-07-29 ENCOUNTER — Other Ambulatory Visit: Payer: Self-pay | Admitting: *Deleted

## 2015-07-29 ENCOUNTER — Other Ambulatory Visit (INDEPENDENT_AMBULATORY_CARE_PROVIDER_SITE_OTHER): Payer: Medicare Other

## 2015-07-29 DIAGNOSIS — I10 Essential (primary) hypertension: Secondary | ICD-10-CM

## 2015-07-29 DIAGNOSIS — E890 Postprocedural hypothyroidism: Secondary | ICD-10-CM

## 2015-07-29 LAB — LIPID PANEL
Cholesterol: 219 mg/dL — ABNORMAL HIGH (ref 0–200)
HDL: 41.4 mg/dL (ref >39.00)
LDL Cholesterol: 143 mg/dL — ABNORMAL HIGH (ref 0–99)
NONHDL: 177.45
Total CHOL/HDL Ratio: 5
Triglycerides: 174 mg/dL — ABNORMAL HIGH (ref 0.0–149.0)
VLDL: 34.8 mg/dL (ref 0.0–40.0)

## 2015-07-29 LAB — TSH: TSH: 0.57 u[IU]/mL (ref 0.35–4.50)

## 2015-07-29 LAB — T4, FREE: Free T4: 1.31 ng/dL (ref 0.60–1.60)

## 2015-08-01 ENCOUNTER — Telehealth: Payer: Self-pay | Admitting: Endocrinology

## 2015-08-01 NOTE — Telephone Encounter (Signed)
Patient is calling for the results of her lab work °

## 2015-08-01 NOTE — Telephone Encounter (Signed)
Please see below and advise.

## 2015-08-01 NOTE — Telephone Encounter (Signed)
Results given to patient, per her request a copy of her labs were mailed to her.

## 2015-08-01 NOTE — Telephone Encounter (Signed)
Results from 11/7 showed normal thyroid levels, she can continue to leave off 1 tablet on Fridays and follow-up as scheduled

## 2015-08-07 ENCOUNTER — Telehealth: Payer: Self-pay | Admitting: Endocrinology

## 2015-08-07 DIAGNOSIS — R791 Abnormal coagulation profile: Secondary | ICD-10-CM | POA: Diagnosis not present

## 2015-08-07 NOTE — Telephone Encounter (Signed)
They were originally mailed on the 10th, but sent a second copy to her.

## 2015-08-07 NOTE — Telephone Encounter (Signed)
Patient called and would like a copy of her most recent labs sent to her home address   Please advise    Thank you

## 2015-08-08 ENCOUNTER — Telehealth: Payer: Self-pay | Admitting: Endocrinology

## 2015-08-08 NOTE — Telephone Encounter (Signed)
Patient have a question about her labwork, and ask Dr Dwyane Dee how how she is suppose to take her medication, make sure you ask him before you call her back.

## 2015-08-08 NOTE — Telephone Encounter (Signed)
Patient was confused and thought she was told to not take any on Friday, I clarified to her that she was only suppose to take 1 on Friday.

## 2015-08-08 NOTE — Telephone Encounter (Signed)
Please clarify.  Information is available in my last telephone encounter/result note

## 2015-08-08 NOTE — Telephone Encounter (Signed)
Please see below and advise.

## 2015-08-13 DIAGNOSIS — Z9889 Other specified postprocedural states: Secondary | ICD-10-CM | POA: Diagnosis not present

## 2015-08-13 DIAGNOSIS — E785 Hyperlipidemia, unspecified: Secondary | ICD-10-CM | POA: Diagnosis not present

## 2015-08-13 DIAGNOSIS — I10 Essential (primary) hypertension: Secondary | ICD-10-CM | POA: Diagnosis not present

## 2015-08-13 DIAGNOSIS — I480 Paroxysmal atrial fibrillation: Secondary | ICD-10-CM | POA: Diagnosis not present

## 2015-08-13 DIAGNOSIS — Z79899 Other long term (current) drug therapy: Secondary | ICD-10-CM | POA: Diagnosis not present

## 2015-08-21 DIAGNOSIS — R791 Abnormal coagulation profile: Secondary | ICD-10-CM | POA: Diagnosis not present

## 2015-08-23 ENCOUNTER — Other Ambulatory Visit: Payer: Self-pay | Admitting: Endocrinology

## 2015-09-04 DIAGNOSIS — K219 Gastro-esophageal reflux disease without esophagitis: Secondary | ICD-10-CM | POA: Diagnosis not present

## 2015-09-04 DIAGNOSIS — Z6836 Body mass index (BMI) 36.0-36.9, adult: Secondary | ICD-10-CM | POA: Diagnosis not present

## 2015-09-04 DIAGNOSIS — R791 Abnormal coagulation profile: Secondary | ICD-10-CM | POA: Diagnosis not present

## 2015-09-04 DIAGNOSIS — J309 Allergic rhinitis, unspecified: Secondary | ICD-10-CM | POA: Diagnosis not present

## 2015-09-04 DIAGNOSIS — R300 Dysuria: Secondary | ICD-10-CM | POA: Diagnosis not present

## 2015-09-04 DIAGNOSIS — N390 Urinary tract infection, site not specified: Secondary | ICD-10-CM | POA: Diagnosis not present

## 2015-09-04 DIAGNOSIS — F39 Unspecified mood [affective] disorder: Secondary | ICD-10-CM | POA: Diagnosis not present

## 2015-09-04 DIAGNOSIS — I4891 Unspecified atrial fibrillation: Secondary | ICD-10-CM | POA: Diagnosis not present

## 2015-09-04 DIAGNOSIS — E039 Hypothyroidism, unspecified: Secondary | ICD-10-CM | POA: Diagnosis not present

## 2015-09-04 DIAGNOSIS — E669 Obesity, unspecified: Secondary | ICD-10-CM | POA: Diagnosis not present

## 2015-09-04 DIAGNOSIS — I10 Essential (primary) hypertension: Secondary | ICD-10-CM | POA: Diagnosis not present

## 2015-09-04 DIAGNOSIS — E785 Hyperlipidemia, unspecified: Secondary | ICD-10-CM | POA: Diagnosis not present

## 2015-09-25 DIAGNOSIS — R791 Abnormal coagulation profile: Secondary | ICD-10-CM | POA: Diagnosis not present

## 2015-10-02 DIAGNOSIS — R791 Abnormal coagulation profile: Secondary | ICD-10-CM | POA: Diagnosis not present

## 2015-10-07 DIAGNOSIS — E890 Postprocedural hypothyroidism: Secondary | ICD-10-CM | POA: Diagnosis not present

## 2015-10-07 DIAGNOSIS — I10 Essential (primary) hypertension: Secondary | ICD-10-CM | POA: Diagnosis not present

## 2015-10-07 DIAGNOSIS — R5383 Other fatigue: Secondary | ICD-10-CM | POA: Diagnosis not present

## 2015-10-07 DIAGNOSIS — Z9889 Other specified postprocedural states: Secondary | ICD-10-CM | POA: Diagnosis not present

## 2015-10-07 DIAGNOSIS — I480 Paroxysmal atrial fibrillation: Secondary | ICD-10-CM | POA: Diagnosis not present

## 2015-10-08 DIAGNOSIS — L729 Follicular cyst of the skin and subcutaneous tissue, unspecified: Secondary | ICD-10-CM | POA: Diagnosis not present

## 2015-10-08 DIAGNOSIS — Z6835 Body mass index (BMI) 35.0-35.9, adult: Secondary | ICD-10-CM | POA: Diagnosis not present

## 2015-10-08 DIAGNOSIS — E039 Hypothyroidism, unspecified: Secondary | ICD-10-CM | POA: Diagnosis not present

## 2015-10-08 DIAGNOSIS — R002 Palpitations: Secondary | ICD-10-CM | POA: Diagnosis not present

## 2015-10-08 DIAGNOSIS — R791 Abnormal coagulation profile: Secondary | ICD-10-CM | POA: Diagnosis not present

## 2015-10-08 DIAGNOSIS — H6122 Impacted cerumen, left ear: Secondary | ICD-10-CM | POA: Diagnosis not present

## 2015-10-08 DIAGNOSIS — N390 Urinary tract infection, site not specified: Secondary | ICD-10-CM | POA: Diagnosis not present

## 2015-10-08 DIAGNOSIS — I4891 Unspecified atrial fibrillation: Secondary | ICD-10-CM | POA: Diagnosis not present

## 2015-10-08 DIAGNOSIS — R8290 Unspecified abnormal findings in urine: Secondary | ICD-10-CM | POA: Diagnosis not present

## 2015-10-09 ENCOUNTER — Telehealth: Payer: Self-pay | Admitting: Endocrinology

## 2015-10-09 NOTE — Telephone Encounter (Signed)
Patient ask if you received a fax Monday from Dr Ola Spurr of her thyroid labs, please advise

## 2015-10-09 NOTE — Telephone Encounter (Signed)
I called Chelsea Warren back to let her know that we have not received them yet.   She is going to call and have them fax records to our office.

## 2015-10-10 ENCOUNTER — Telehealth: Payer: Self-pay | Admitting: Endocrinology

## 2015-10-10 NOTE — Telephone Encounter (Signed)
error 

## 2015-10-10 NOTE — Telephone Encounter (Signed)
Noted, patient is aware. 

## 2015-10-10 NOTE — Telephone Encounter (Signed)
Patient said she has been taking 2 tablets daily except on Tuesday and Friday, she only takes 1 tablet on those days. Please advise.

## 2015-10-10 NOTE — Telephone Encounter (Signed)
Also skip 1 tablet on Sunday in addition to skipping on Tuesday and Friday

## 2015-10-10 NOTE — Telephone Encounter (Signed)
TSH done by cardiologist on 10/07/15 is 0.4 Need to confirm that the patient is taking 2 tablets Synthroid daily except one on Friday. If this dose is correct she will take 2 tablets daily and 1 on Tuesdays and Fridays Follow-up to be changed to 6 weeks from now

## 2015-10-15 DIAGNOSIS — R791 Abnormal coagulation profile: Secondary | ICD-10-CM | POA: Diagnosis not present

## 2015-10-17 DIAGNOSIS — I480 Paroxysmal atrial fibrillation: Secondary | ICD-10-CM | POA: Diagnosis not present

## 2015-10-17 DIAGNOSIS — R5383 Other fatigue: Secondary | ICD-10-CM | POA: Diagnosis not present

## 2015-10-18 DIAGNOSIS — I480 Paroxysmal atrial fibrillation: Secondary | ICD-10-CM | POA: Diagnosis not present

## 2015-10-22 DIAGNOSIS — R791 Abnormal coagulation profile: Secondary | ICD-10-CM | POA: Diagnosis not present

## 2015-10-23 DIAGNOSIS — I480 Paroxysmal atrial fibrillation: Secondary | ICD-10-CM | POA: Diagnosis not present

## 2015-10-28 ENCOUNTER — Ambulatory Visit: Payer: Medicare Other | Admitting: Endocrinology

## 2015-10-29 DIAGNOSIS — Z79899 Other long term (current) drug therapy: Secondary | ICD-10-CM | POA: Diagnosis not present

## 2015-10-29 DIAGNOSIS — Z7901 Long term (current) use of anticoagulants: Secondary | ICD-10-CM | POA: Diagnosis not present

## 2015-10-29 DIAGNOSIS — E669 Obesity, unspecified: Secondary | ICD-10-CM | POA: Diagnosis not present

## 2015-10-29 DIAGNOSIS — E039 Hypothyroidism, unspecified: Secondary | ICD-10-CM | POA: Diagnosis not present

## 2015-10-29 DIAGNOSIS — N390 Urinary tract infection, site not specified: Secondary | ICD-10-CM | POA: Diagnosis not present

## 2015-10-29 DIAGNOSIS — M545 Low back pain: Secondary | ICD-10-CM | POA: Diagnosis not present

## 2015-10-29 DIAGNOSIS — F419 Anxiety disorder, unspecified: Secondary | ICD-10-CM | POA: Diagnosis not present

## 2015-11-05 DIAGNOSIS — R791 Abnormal coagulation profile: Secondary | ICD-10-CM | POA: Diagnosis not present

## 2015-11-12 DIAGNOSIS — R791 Abnormal coagulation profile: Secondary | ICD-10-CM | POA: Diagnosis not present

## 2015-11-19 DIAGNOSIS — Z9889 Other specified postprocedural states: Secondary | ICD-10-CM | POA: Diagnosis not present

## 2015-11-19 DIAGNOSIS — E039 Hypothyroidism, unspecified: Secondary | ICD-10-CM | POA: Diagnosis not present

## 2015-11-19 DIAGNOSIS — I480 Paroxysmal atrial fibrillation: Secondary | ICD-10-CM | POA: Diagnosis not present

## 2015-11-19 DIAGNOSIS — E785 Hyperlipidemia, unspecified: Secondary | ICD-10-CM | POA: Diagnosis not present

## 2015-11-19 DIAGNOSIS — I10 Essential (primary) hypertension: Secondary | ICD-10-CM | POA: Diagnosis not present

## 2015-11-21 ENCOUNTER — Ambulatory Visit: Payer: Medicare Other | Admitting: Endocrinology

## 2015-11-26 DIAGNOSIS — I10 Essential (primary) hypertension: Secondary | ICD-10-CM | POA: Diagnosis not present

## 2015-11-26 DIAGNOSIS — F419 Anxiety disorder, unspecified: Secondary | ICD-10-CM | POA: Diagnosis not present

## 2015-11-26 DIAGNOSIS — Z7901 Long term (current) use of anticoagulants: Secondary | ICD-10-CM | POA: Diagnosis not present

## 2015-11-26 DIAGNOSIS — E039 Hypothyroidism, unspecified: Secondary | ICD-10-CM | POA: Diagnosis not present

## 2015-11-26 DIAGNOSIS — Z6835 Body mass index (BMI) 35.0-35.9, adult: Secondary | ICD-10-CM | POA: Diagnosis not present

## 2015-11-26 DIAGNOSIS — J309 Allergic rhinitis, unspecified: Secondary | ICD-10-CM | POA: Diagnosis not present

## 2015-11-26 DIAGNOSIS — E785 Hyperlipidemia, unspecified: Secondary | ICD-10-CM | POA: Diagnosis not present

## 2015-11-28 DIAGNOSIS — I480 Paroxysmal atrial fibrillation: Secondary | ICD-10-CM | POA: Diagnosis not present

## 2015-11-28 DIAGNOSIS — E039 Hypothyroidism, unspecified: Secondary | ICD-10-CM | POA: Diagnosis not present

## 2015-11-28 DIAGNOSIS — I10 Essential (primary) hypertension: Secondary | ICD-10-CM | POA: Diagnosis not present

## 2015-11-28 DIAGNOSIS — R42 Dizziness and giddiness: Secondary | ICD-10-CM | POA: Diagnosis not present

## 2015-12-18 ENCOUNTER — Ambulatory Visit (INDEPENDENT_AMBULATORY_CARE_PROVIDER_SITE_OTHER): Payer: Medicare Other

## 2015-12-18 ENCOUNTER — Ambulatory Visit (INDEPENDENT_AMBULATORY_CARE_PROVIDER_SITE_OTHER): Payer: Medicare Other | Admitting: Sports Medicine

## 2015-12-18 ENCOUNTER — Encounter: Payer: Self-pay | Admitting: Sports Medicine

## 2015-12-18 DIAGNOSIS — F419 Anxiety disorder, unspecified: Secondary | ICD-10-CM | POA: Diagnosis not present

## 2015-12-18 DIAGNOSIS — Z6835 Body mass index (BMI) 35.0-35.9, adult: Secondary | ICD-10-CM | POA: Diagnosis not present

## 2015-12-18 DIAGNOSIS — E669 Obesity, unspecified: Secondary | ICD-10-CM | POA: Diagnosis not present

## 2015-12-18 DIAGNOSIS — L603 Nail dystrophy: Secondary | ICD-10-CM

## 2015-12-18 DIAGNOSIS — I4891 Unspecified atrial fibrillation: Secondary | ICD-10-CM | POA: Diagnosis not present

## 2015-12-18 DIAGNOSIS — M79671 Pain in right foot: Secondary | ICD-10-CM

## 2015-12-18 DIAGNOSIS — B351 Tinea unguium: Secondary | ICD-10-CM | POA: Diagnosis not present

## 2015-12-18 DIAGNOSIS — Z7901 Long term (current) use of anticoagulants: Secondary | ICD-10-CM | POA: Diagnosis not present

## 2015-12-18 DIAGNOSIS — I10 Essential (primary) hypertension: Secondary | ICD-10-CM | POA: Diagnosis not present

## 2015-12-18 NOTE — Progress Notes (Signed)
Patient ID: Chelsea Warren, female   DOB: [birth year], 75 y.o.   MRN: [id number] Subjective: Chelsea Warren is a 75 y.o. female patient seen today in office with complaint of painful thickened and discolored nail at right hallux; patient states that her nail is sore and is worse with closed toed shoes. Patient is desiring treatment for nail changes; has not tried anything for ti. Reports that nails are becoming difficult to manage because of the thickness and tenderness to the end of the toenail. Denies injury, trauma or any other causative factors attributing to nail change. Patient has no other pedal complaints at this time.   Patient Active Problem List   Diagnosis Date Noted  . History of mitral valve repair 04/25/2015  . History of maze procedure 04/25/2015  . Essential hypertension, benign 06/08/2013  . Post-surgical hypothyroidism 06/06/2013  . Hypothyroidism, postop 06/06/2013  . Lower urinary tract infection 07/01/2012  . Acute posthemorrhagic anemia 06/08/2012  . Thrombocytopenia ([place]) 06/08/2012  . Arteriosclerosis of coronary artery 06/07/2012  . Cardiac arrhythmia 06/07/2012  . Acid reflux 06/07/2012  . Class 1 obesity 06/07/2012  . Cataract 06/03/2012  . Chronic systolic heart failure ([place]) 06/03/2012  . 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 HLD (hyperlipidemia) 06/03/2012  . BP (high blood pressure) 06/03/2012  . MI (mitral incompetence) 06/03/2012  . Paroxysmal atrial fibrillation ([place]) 06/03/2012  . Calculus (=stone) 06/03/2012    Current Outpatient Prescriptions on File Prior to Visit  Medication Sig Dispense Refill  . ALPRAZolam (XANAX) 0.5 MG tablet Take 0.5 mg by mouth 3 (three) times daily as needed for sleep.    Marland Kitchen dofetilide (TIKOSYN) 125 MCG capsule Take 125 mcg by mouth 2 (two) times daily.    . fluticasone (FLONASE) 50 MCG/ACT nasal spray   0  . lisinopril (PRINIVIL,ZESTRIL) 20 MG tablet Take 20 mg by mouth daily.    . meclizine (ANTIVERT) 12.5 MG tablet   0  .  nitroGLYCERIN (NITROSTAT) 0.6 MG SL tablet Place 0.6 mg under the tongue every 5 (five) minutes as needed for chest pain.    . prednisoLONE acetate (PRED FORTE) 1 % ophthalmic suspension   0  . PROLENSA 0.07 % SOLN   0  . SYNTHROID 112 MCG tablet TAKE 2 TABLETS DAILY BEFORE BREAKFAST 180 tablet 1  . warfarin (COUMADIN) 5 MG tablet Take 5 mg by mouth daily. 5 mg every day except Wednesday, take 5.5 on Wednesday     No current facility-administered medications on file prior to visit.    Allergies  Allergen Reactions  . Amiodarone   . Antihistamines, Chlorpheniramine-Type   . Codeine   . Lidocaine   . Morphine And Related   . Sotalol     Objective: Physical Exam  General: Well developed, nourished, no acute distress, awake, alert and oriented x 3  Vascular: Dorsalis pedis artery 2/4 bilateral, Posterior tibial artery 1/4 bilateral, skin temperature warm to warm proximal to distal bilateral lower extremities, mild varicosities, scant pedal hair present bilateral.  Neurological: Gross sensation present via light touch bilateral.   Dermatological: Skin is warm, dry, and supple bilateral, Right hallux nail is thick, and discolored with mild subungal debris, all other nails are within normal limits, no webspace macerations present bilateral, no open lesions present bilateral, no callus/corns/hyperkeratotic tissue present bilateral. No signs of infection bilateral.  Musculoskeletal: No symptomatic boney deformities noted bilateral. Muscular strength within normal limits without painon range of motion. No pain with calf compression bilateral.  Xrays Right foot: Normal  osseous mineralization. There is mild arthritic changes at the distal interphalangeal joint of the hallux, first metatarsophalangeal joint, midfoot and ankle. There is also significant posterior heel spur. There is no fracture or acute dislocation, no other acute findings. Soft tissues within normal limits.   Assessment and  Plan:  Problem List Items Addressed This Visit    None    Visit Diagnoses    Right foot pain    -  Primary    Relevant Orders    DG Foot 2 Views Right    Nail dystrophy        Right hallux nail    Relevant Orders    Fungus Culture with Smear      -Examined patient -X-rays obtained due to chronicity of pain and symptoms and reviewed -Discussed treatment options for painful dystrophic right hallux nail  -Fungal culture was obtained and sent to [facility] lab -Patient to return in 4 weeks for follow up evaluation and discussion of fungal culture results or sooner if symptoms worsen.  Landis Martins, DPM

## 2015-12-23 DIAGNOSIS — E890 Postprocedural hypothyroidism: Secondary | ICD-10-CM | POA: Diagnosis not present

## 2015-12-25 DIAGNOSIS — R791 Abnormal coagulation profile: Secondary | ICD-10-CM | POA: Diagnosis not present

## 2016-01-01 DIAGNOSIS — Z7901 Long term (current) use of anticoagulants: Secondary | ICD-10-CM | POA: Diagnosis not present

## 2016-01-07 DIAGNOSIS — R791 Abnormal coagulation profile: Secondary | ICD-10-CM | POA: Diagnosis not present

## 2016-01-14 DIAGNOSIS — Z7901 Long term (current) use of anticoagulants: Secondary | ICD-10-CM | POA: Diagnosis not present

## 2016-01-21 DIAGNOSIS — I10 Essential (primary) hypertension: Secondary | ICD-10-CM | POA: Diagnosis not present

## 2016-01-21 DIAGNOSIS — I480 Paroxysmal atrial fibrillation: Secondary | ICD-10-CM | POA: Diagnosis not present

## 2016-01-21 DIAGNOSIS — E890 Postprocedural hypothyroidism: Secondary | ICD-10-CM | POA: Diagnosis not present

## 2016-01-21 DIAGNOSIS — Z9889 Other specified postprocedural states: Secondary | ICD-10-CM | POA: Diagnosis not present

## 2016-01-21 DIAGNOSIS — E785 Hyperlipidemia, unspecified: Secondary | ICD-10-CM | POA: Diagnosis not present

## 2016-01-21 DIAGNOSIS — Z79899 Other long term (current) drug therapy: Secondary | ICD-10-CM | POA: Diagnosis not present

## 2016-01-22 ENCOUNTER — Ambulatory Visit (INDEPENDENT_AMBULATORY_CARE_PROVIDER_SITE_OTHER): Payer: Medicare Other | Admitting: Sports Medicine

## 2016-01-22 ENCOUNTER — Encounter: Payer: Self-pay | Admitting: Sports Medicine

## 2016-01-22 DIAGNOSIS — L603 Nail dystrophy: Secondary | ICD-10-CM

## 2016-01-22 DIAGNOSIS — Z7901 Long term (current) use of anticoagulants: Secondary | ICD-10-CM | POA: Diagnosis not present

## 2016-01-22 DIAGNOSIS — M79674 Pain in right toe(s): Secondary | ICD-10-CM

## 2016-01-22 MED ORDER — NUVAIL EX SOLN: 1.0000 "application " | Freq: Every day | CUTANEOUS | Status: AC

## 2016-01-22 NOTE — Progress Notes (Signed)
Patient ID: Chelsea Warren, female   DOB: 06/08/1941, 75 y.o.   MRN: [id number] Subjective: Chelsea Warren is a 75 y.o. female patient seen today in office for fungal culture results. Patient has no other pedal complaints at this time.   Patient Active Problem List   Diagnosis Date Noted  . History of mitral valve repair 04/25/2015  . History of maze procedure 04/25/2015  . Essential hypertension, benign 06/08/2013  . Post-surgical hypothyroidism 06/06/2013  . Hypothyroidism, postop 06/06/2013  . Lower urinary tract infection 07/01/2012  . Acute posthemorrhagic anemia 06/08/2012  . Thrombocytopenia ([place]) 06/08/2012  . Arteriosclerosis of coronary artery 06/07/2012  . Cardiac arrhythmia 06/07/2012  . Acid reflux 06/07/2012  . Class 1 obesity 06/07/2012  . Cataract 06/03/2012  . Chronic systolic heart failure ([place]) 06/03/2012  . 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 HLD (hyperlipidemia) 06/03/2012  . BP (high blood pressure) 06/03/2012  . MI (mitral incompetence) 06/03/2012  . Paroxysmal atrial fibrillation ([place]) 06/03/2012  . Calculus (=stone) 06/03/2012    Current Outpatient Prescriptions on File Prior to Visit  Medication Sig Dispense Refill  . ALPRAZolam (XANAX) 0.5 MG tablet Take 0.5 mg by mouth 3 (three) times daily as needed for sleep.    Marland Kitchen ALPRAZolam (XANAX) 1 MG tablet     . dofetilide (TIKOSYN) 125 MCG capsule Take 125 mcg by mouth 2 (two) times daily.    . fluticasone (FLONASE) 50 MCG/ACT nasal spray   0  . lisinopril (PRINIVIL,ZESTRIL) 20 MG tablet Take 20 mg by mouth daily.    . meclizine (ANTIVERT) 12.5 MG tablet   0  . nitroGLYCERIN (NITROSTAT) 0.6 MG SL tablet Place 0.6 mg under the tongue every 5 (five) minutes as needed for chest pain.    . prednisoLONE acetate (PRED FORTE) 1 % ophthalmic suspension   0  . PROLENSA 0.07 % SOLN   0  . SYNTHROID 112 MCG tablet TAKE 2 TABLETS DAILY BEFORE BREAKFAST 180 tablet 1  . Vitamin D, Ergocalciferol, (DRISDOL) 50000 units CAPS  capsule     . warfarin (COUMADIN) 5 MG tablet Take 5 mg by mouth daily. 5 mg every day except Wednesday, take 5.5 on Wednesday     No current facility-administered medications on file prior to visit.    Allergies  Allergen Reactions  . Amiodarone Other (See Comments)  . Antihistamines, Chlorpheniramine-Type Other (See Comments)    OTHER REACTION OTHER REACTION  . Antihistamines, Diphenhydramine-Type Other (See Comments)  . Buprenorphine Hcl Other (See Comments)  . Codeine Other (See Comments)    OTHER REACTION  . Lidocaine Other (See Comments)    OTHER REACTION OTHER REACTION  . Morphine Other (See Comments)  . Morphine And Related   . Pheniramine Other (See Comments)  . Sotalol Other (See Comments)  . Hydromorphone Itching    Objective: Physical Exam  General: Well developed, nourished, no acute distress, awake, alert and oriented x 3  Vascular: Dorsalis pedis artery 2/4 bilateral, Posterior tibial artery 1/4 bilateral, skin temperature warm to warm proximal to distal bilateral lower extremities, no varicosities, scant pedal hair present bilateral.  Neurological: Gross sensation present via light touch bilateral.   Dermatological: Skin is warm, dry, and supple bilateral, Right hallux, short thick, and discolored with mild subungal debris. All other nails are within normal limits, no webspace macerations present bilateral, no open lesions present bilateral, no callus/corns/hyperkeratotic tissue present bilateral. No signs of infection bilateral.  Musculoskeletal: No symptomatic boney deformities noted bilateral. Muscular strength within normal limits without  painon range of motion. No pain with calf compression bilateral.  Fungal culture-negative suggestive of none dermatophyte mold  Assessment and Plan:  Problem List Items Addressed This Visit    None    Visit Diagnoses    Nail dystrophy    -  Primary    Relevant Medications    Dermatological Products, Misc. (NUVAIL)  SOLN    Toe pain, right           -Examined patient -Discussed treatment options for painful Dystrophic nail -Patient opt for topical; [facility] I advised patient that in between applications of nuvail to keep nails filed thin and short for the best penetration of the medication -Advised good hygiene habits -Patient to return as needed for follow up evaluation or sooner if symptoms worsen.  Landis Martins, DPM

## 2016-01-22 NOTE — Patient Instructions (Signed)
Nuvail for nail dystrophy. Apply to nails once a day and file nails once a week while using topical medication

## 2016-01-29 DIAGNOSIS — R791 Abnormal coagulation profile: Secondary | ICD-10-CM | POA: Diagnosis not present

## 2016-02-04 DIAGNOSIS — Z7901 Long term (current) use of anticoagulants: Secondary | ICD-10-CM | POA: Diagnosis not present

## 2016-02-11 ENCOUNTER — Encounter: Payer: Self-pay | Admitting: Sports Medicine

## 2016-02-11 DIAGNOSIS — Z7901 Long term (current) use of anticoagulants: Secondary | ICD-10-CM | POA: Diagnosis not present

## 2016-02-18 DIAGNOSIS — Z7901 Long term (current) use of anticoagulants: Secondary | ICD-10-CM | POA: Diagnosis not present

## 2016-02-26 DIAGNOSIS — Z9889 Other specified postprocedural states: Secondary | ICD-10-CM | POA: Diagnosis not present

## 2016-02-26 DIAGNOSIS — I2510 Atherosclerotic heart disease of native coronary artery without angina pectoris: Secondary | ICD-10-CM | POA: Diagnosis not present

## 2016-02-26 DIAGNOSIS — I5022 Chronic systolic (congestive) heart failure: Secondary | ICD-10-CM | POA: Diagnosis not present

## 2016-02-26 DIAGNOSIS — I10 Essential (primary) hypertension: Secondary | ICD-10-CM | POA: Diagnosis not present

## 2016-02-26 DIAGNOSIS — I499 Cardiac arrhythmia, unspecified: Secondary | ICD-10-CM | POA: Diagnosis not present

## 2016-02-26 DIAGNOSIS — I480 Paroxysmal atrial fibrillation: Secondary | ICD-10-CM | POA: Diagnosis not present

## 2016-02-28 DIAGNOSIS — Z7901 Long term (current) use of anticoagulants: Secondary | ICD-10-CM | POA: Diagnosis not present

## 2016-03-04 DIAGNOSIS — N390 Urinary tract infection, site not specified: Secondary | ICD-10-CM | POA: Diagnosis not present

## 2016-03-04 DIAGNOSIS — E669 Obesity, unspecified: Secondary | ICD-10-CM | POA: Diagnosis not present

## 2016-03-04 DIAGNOSIS — H00019 Hordeolum externum unspecified eye, unspecified eyelid: Secondary | ICD-10-CM | POA: Diagnosis not present

## 2016-03-04 DIAGNOSIS — J069 Acute upper respiratory infection, unspecified: Secondary | ICD-10-CM | POA: Diagnosis not present

## 2016-03-04 DIAGNOSIS — Z7901 Long term (current) use of anticoagulants: Secondary | ICD-10-CM | POA: Diagnosis not present

## 2016-03-04 DIAGNOSIS — Z6834 Body mass index (BMI) 34.0-34.9, adult: Secondary | ICD-10-CM | POA: Diagnosis not present

## 2016-03-04 DIAGNOSIS — R300 Dysuria: Secondary | ICD-10-CM | POA: Diagnosis not present

## 2016-03-04 DIAGNOSIS — J029 Acute pharyngitis, unspecified: Secondary | ICD-10-CM | POA: Diagnosis not present

## 2016-03-06 DIAGNOSIS — F419 Anxiety disorder, unspecified: Secondary | ICD-10-CM | POA: Diagnosis present

## 2016-03-06 DIAGNOSIS — I480 Paroxysmal atrial fibrillation: Secondary | ICD-10-CM | POA: Diagnosis present

## 2016-03-06 DIAGNOSIS — I110 Hypertensive heart disease with heart failure: Secondary | ICD-10-CM | POA: Diagnosis present

## 2016-03-06 DIAGNOSIS — I340 Nonrheumatic mitral (valve) insufficiency: Secondary | ICD-10-CM | POA: Diagnosis present

## 2016-03-06 DIAGNOSIS — Z6835 Body mass index (BMI) 35.0-35.9, adult: Secondary | ICD-10-CM | POA: Diagnosis not present

## 2016-03-06 DIAGNOSIS — E7800 Pure hypercholesterolemia, unspecified: Secondary | ICD-10-CM | POA: Diagnosis present

## 2016-03-06 DIAGNOSIS — I10 Essential (primary) hypertension: Secondary | ICD-10-CM | POA: Diagnosis not present

## 2016-03-06 DIAGNOSIS — N390 Urinary tract infection, site not specified: Secondary | ICD-10-CM | POA: Diagnosis present

## 2016-03-06 DIAGNOSIS — E890 Postprocedural hypothyroidism: Secondary | ICD-10-CM | POA: Diagnosis present

## 2016-03-06 DIAGNOSIS — Z952 Presence of prosthetic heart valve: Secondary | ICD-10-CM | POA: Diagnosis not present

## 2016-03-06 DIAGNOSIS — I517 Cardiomegaly: Secondary | ICD-10-CM | POA: Diagnosis not present

## 2016-03-06 DIAGNOSIS — I4891 Unspecified atrial fibrillation: Secondary | ICD-10-CM | POA: Diagnosis not present

## 2016-03-06 DIAGNOSIS — R002 Palpitations: Secondary | ICD-10-CM | POA: Diagnosis not present

## 2016-03-06 DIAGNOSIS — I341 Nonrheumatic mitral (valve) prolapse: Secondary | ICD-10-CM | POA: Diagnosis not present

## 2016-03-06 DIAGNOSIS — Z7901 Long term (current) use of anticoagulants: Secondary | ICD-10-CM | POA: Diagnosis not present

## 2016-03-06 DIAGNOSIS — Z79899 Other long term (current) drug therapy: Secondary | ICD-10-CM | POA: Diagnosis not present

## 2016-03-06 DIAGNOSIS — R079 Chest pain, unspecified: Secondary | ICD-10-CM | POA: Diagnosis not present

## 2016-03-06 DIAGNOSIS — R0602 Shortness of breath: Secondary | ICD-10-CM | POA: Diagnosis not present

## 2016-03-06 DIAGNOSIS — I5022 Chronic systolic (congestive) heart failure: Secondary | ICD-10-CM | POA: Diagnosis not present

## 2016-03-06 DIAGNOSIS — J189 Pneumonia, unspecified organism: Secondary | ICD-10-CM | POA: Diagnosis not present

## 2016-03-06 DIAGNOSIS — I119 Hypertensive heart disease without heart failure: Secondary | ICD-10-CM | POA: Diagnosis not present

## 2016-03-13 DIAGNOSIS — I480 Paroxysmal atrial fibrillation: Secondary | ICD-10-CM | POA: Diagnosis not present

## 2016-03-18 DIAGNOSIS — Z7901 Long term (current) use of anticoagulants: Secondary | ICD-10-CM | POA: Diagnosis not present

## 2016-03-18 DIAGNOSIS — Z09 Encounter for follow-up examination after completed treatment for conditions other than malignant neoplasm: Secondary | ICD-10-CM | POA: Diagnosis not present

## 2016-03-18 DIAGNOSIS — Z6835 Body mass index (BMI) 35.0-35.9, adult: Secondary | ICD-10-CM | POA: Diagnosis not present

## 2016-03-18 DIAGNOSIS — I4891 Unspecified atrial fibrillation: Secondary | ICD-10-CM | POA: Diagnosis not present

## 2016-03-18 DIAGNOSIS — J189 Pneumonia, unspecified organism: Secondary | ICD-10-CM | POA: Diagnosis not present

## 2016-03-18 DIAGNOSIS — Z79899 Other long term (current) drug therapy: Secondary | ICD-10-CM | POA: Diagnosis not present

## 2016-03-25 DIAGNOSIS — I4891 Unspecified atrial fibrillation: Secondary | ICD-10-CM | POA: Diagnosis not present

## 2016-03-25 DIAGNOSIS — N8110 Cystocele, unspecified: Secondary | ICD-10-CM | POA: Diagnosis not present

## 2016-03-25 DIAGNOSIS — E890 Postprocedural hypothyroidism: Secondary | ICD-10-CM | POA: Diagnosis not present

## 2016-03-25 DIAGNOSIS — E785 Hyperlipidemia, unspecified: Secondary | ICD-10-CM | POA: Diagnosis not present

## 2016-03-25 DIAGNOSIS — I10 Essential (primary) hypertension: Secondary | ICD-10-CM | POA: Diagnosis not present

## 2016-03-25 DIAGNOSIS — Z9889 Other specified postprocedural states: Secondary | ICD-10-CM | POA: Diagnosis not present

## 2016-03-26 DIAGNOSIS — Z7901 Long term (current) use of anticoagulants: Secondary | ICD-10-CM | POA: Diagnosis not present

## 2016-03-27 DIAGNOSIS — I4891 Unspecified atrial fibrillation: Secondary | ICD-10-CM | POA: Diagnosis not present

## 2016-04-02 DIAGNOSIS — Z7901 Long term (current) use of anticoagulants: Secondary | ICD-10-CM | POA: Diagnosis not present

## 2016-04-02 DIAGNOSIS — I10 Essential (primary) hypertension: Secondary | ICD-10-CM | POA: Diagnosis not present

## 2016-04-02 DIAGNOSIS — F419 Anxiety disorder, unspecified: Secondary | ICD-10-CM | POA: Diagnosis not present

## 2016-04-02 DIAGNOSIS — Z6835 Body mass index (BMI) 35.0-35.9, adult: Secondary | ICD-10-CM | POA: Diagnosis not present

## 2016-04-08 DIAGNOSIS — N8110 Cystocele, unspecified: Secondary | ICD-10-CM | POA: Diagnosis not present

## 2016-04-09 DIAGNOSIS — Z9889 Other specified postprocedural states: Secondary | ICD-10-CM | POA: Diagnosis not present

## 2016-04-09 DIAGNOSIS — I493 Ventricular premature depolarization: Secondary | ICD-10-CM | POA: Diagnosis not present

## 2016-04-09 DIAGNOSIS — I10 Essential (primary) hypertension: Secondary | ICD-10-CM | POA: Diagnosis not present

## 2016-04-09 DIAGNOSIS — I480 Paroxysmal atrial fibrillation: Secondary | ICD-10-CM | POA: Diagnosis not present

## 2016-04-15 DIAGNOSIS — Z7901 Long term (current) use of anticoagulants: Secondary | ICD-10-CM | POA: Diagnosis not present

## 2016-04-16 DIAGNOSIS — E890 Postprocedural hypothyroidism: Secondary | ICD-10-CM | POA: Diagnosis not present

## 2016-04-16 DIAGNOSIS — H26493 Other secondary cataract, bilateral: Secondary | ICD-10-CM | POA: Diagnosis not present

## 2016-04-16 DIAGNOSIS — H04123 Dry eye syndrome of bilateral lacrimal glands: Secondary | ICD-10-CM | POA: Diagnosis not present

## 2016-04-16 DIAGNOSIS — H43811 Vitreous degeneration, right eye: Secondary | ICD-10-CM | POA: Diagnosis not present

## 2016-04-28 DIAGNOSIS — I4891 Unspecified atrial fibrillation: Secondary | ICD-10-CM | POA: Diagnosis not present

## 2016-04-28 DIAGNOSIS — Z9889 Other specified postprocedural states: Secondary | ICD-10-CM | POA: Diagnosis not present

## 2016-04-28 DIAGNOSIS — E785 Hyperlipidemia, unspecified: Secondary | ICD-10-CM | POA: Diagnosis not present

## 2016-04-28 DIAGNOSIS — I5022 Chronic systolic (congestive) heart failure: Secondary | ICD-10-CM | POA: Diagnosis not present

## 2016-04-28 DIAGNOSIS — I10 Essential (primary) hypertension: Secondary | ICD-10-CM | POA: Diagnosis not present

## 2016-04-29 DIAGNOSIS — R791 Abnormal coagulation profile: Secondary | ICD-10-CM | POA: Diagnosis not present

## 2016-05-01 DIAGNOSIS — M62830 Muscle spasm of back: Secondary | ICD-10-CM | POA: Diagnosis not present

## 2016-05-01 DIAGNOSIS — M549 Dorsalgia, unspecified: Secondary | ICD-10-CM | POA: Diagnosis not present

## 2016-05-01 DIAGNOSIS — Z6834 Body mass index (BMI) 34.0-34.9, adult: Secondary | ICD-10-CM | POA: Diagnosis not present

## 2016-05-06 DIAGNOSIS — R791 Abnormal coagulation profile: Secondary | ICD-10-CM | POA: Diagnosis not present

## 2016-05-12 DIAGNOSIS — R791 Abnormal coagulation profile: Secondary | ICD-10-CM | POA: Diagnosis not present

## 2016-05-13 DIAGNOSIS — H524 Presbyopia: Secondary | ICD-10-CM | POA: Diagnosis not present

## 2016-05-13 DIAGNOSIS — H43811 Vitreous degeneration, right eye: Secondary | ICD-10-CM | POA: Diagnosis not present

## 2016-05-13 DIAGNOSIS — H04123 Dry eye syndrome of bilateral lacrimal glands: Secondary | ICD-10-CM | POA: Diagnosis not present

## 2016-05-13 DIAGNOSIS — H26492 Other secondary cataract, left eye: Secondary | ICD-10-CM | POA: Diagnosis not present

## 2016-05-19 DIAGNOSIS — R791 Abnormal coagulation profile: Secondary | ICD-10-CM | POA: Diagnosis not present

## 2016-05-21 DIAGNOSIS — H26491 Other secondary cataract, right eye: Secondary | ICD-10-CM | POA: Diagnosis not present

## 2016-05-26 DIAGNOSIS — R791 Abnormal coagulation profile: Secondary | ICD-10-CM | POA: Diagnosis not present

## 2016-06-02 DIAGNOSIS — R791 Abnormal coagulation profile: Secondary | ICD-10-CM | POA: Diagnosis not present

## 2016-06-15 DIAGNOSIS — R791 Abnormal coagulation profile: Secondary | ICD-10-CM | POA: Diagnosis not present

## 2016-06-15 DIAGNOSIS — Z79899 Other long term (current) drug therapy: Secondary | ICD-10-CM | POA: Diagnosis not present

## 2016-06-15 DIAGNOSIS — I4891 Unspecified atrial fibrillation: Secondary | ICD-10-CM | POA: Diagnosis not present

## 2016-06-15 DIAGNOSIS — E559 Vitamin D deficiency, unspecified: Secondary | ICD-10-CM | POA: Diagnosis not present

## 2016-06-15 DIAGNOSIS — E039 Hypothyroidism, unspecified: Secondary | ICD-10-CM | POA: Diagnosis not present

## 2016-06-15 DIAGNOSIS — Z6834 Body mass index (BMI) 34.0-34.9, adult: Secondary | ICD-10-CM | POA: Diagnosis not present

## 2016-06-15 DIAGNOSIS — F39 Unspecified mood [affective] disorder: Secondary | ICD-10-CM | POA: Diagnosis not present

## 2016-06-15 DIAGNOSIS — E785 Hyperlipidemia, unspecified: Secondary | ICD-10-CM | POA: Diagnosis not present

## 2016-06-15 DIAGNOSIS — J309 Allergic rhinitis, unspecified: Secondary | ICD-10-CM | POA: Diagnosis not present

## 2016-06-15 DIAGNOSIS — M159 Polyosteoarthritis, unspecified: Secondary | ICD-10-CM | POA: Diagnosis not present

## 2016-06-15 DIAGNOSIS — Z2821 Immunization not carried out because of patient refusal: Secondary | ICD-10-CM | POA: Diagnosis not present

## 2016-06-22 DIAGNOSIS — E890 Postprocedural hypothyroidism: Secondary | ICD-10-CM | POA: Diagnosis not present

## 2016-07-01 DIAGNOSIS — F419 Anxiety disorder, unspecified: Secondary | ICD-10-CM | POA: Diagnosis not present

## 2016-07-01 DIAGNOSIS — L309 Dermatitis, unspecified: Secondary | ICD-10-CM | POA: Diagnosis not present

## 2016-07-01 DIAGNOSIS — R21 Rash and other nonspecific skin eruption: Secondary | ICD-10-CM | POA: Diagnosis not present

## 2016-07-01 DIAGNOSIS — R791 Abnormal coagulation profile: Secondary | ICD-10-CM | POA: Diagnosis not present

## 2016-07-01 DIAGNOSIS — I10 Essential (primary) hypertension: Secondary | ICD-10-CM | POA: Diagnosis not present

## 2016-07-01 DIAGNOSIS — I4891 Unspecified atrial fibrillation: Secondary | ICD-10-CM | POA: Diagnosis not present

## 2016-07-01 DIAGNOSIS — Z6834 Body mass index (BMI) 34.0-34.9, adult: Secondary | ICD-10-CM | POA: Diagnosis not present

## 2016-07-08 DIAGNOSIS — Z7901 Long term (current) use of anticoagulants: Secondary | ICD-10-CM | POA: Diagnosis not present

## 2016-07-14 DIAGNOSIS — Z7901 Long term (current) use of anticoagulants: Secondary | ICD-10-CM | POA: Diagnosis not present

## 2016-07-14 DIAGNOSIS — J309 Allergic rhinitis, unspecified: Secondary | ICD-10-CM | POA: Diagnosis not present

## 2016-07-14 DIAGNOSIS — I2510 Atherosclerotic heart disease of native coronary artery without angina pectoris: Secondary | ICD-10-CM | POA: Diagnosis not present

## 2016-07-14 DIAGNOSIS — I10 Essential (primary) hypertension: Secondary | ICD-10-CM | POA: Diagnosis not present

## 2016-07-14 DIAGNOSIS — Z9889 Other specified postprocedural states: Secondary | ICD-10-CM | POA: Diagnosis not present

## 2016-07-14 DIAGNOSIS — Z6834 Body mass index (BMI) 34.0-34.9, adult: Secondary | ICD-10-CM | POA: Diagnosis not present

## 2016-07-14 DIAGNOSIS — I4891 Unspecified atrial fibrillation: Secondary | ICD-10-CM | POA: Diagnosis not present

## 2016-07-14 DIAGNOSIS — E669 Obesity, unspecified: Secondary | ICD-10-CM | POA: Diagnosis not present

## 2016-07-14 DIAGNOSIS — I509 Heart failure, unspecified: Secondary | ICD-10-CM | POA: Diagnosis not present

## 2016-07-19 DIAGNOSIS — I5022 Chronic systolic (congestive) heart failure: Secondary | ICD-10-CM | POA: Diagnosis present

## 2016-07-19 DIAGNOSIS — R0902 Hypoxemia: Secondary | ICD-10-CM | POA: Diagnosis not present

## 2016-07-19 DIAGNOSIS — J189 Pneumonia, unspecified organism: Secondary | ICD-10-CM | POA: Diagnosis not present

## 2016-07-19 DIAGNOSIS — I110 Hypertensive heart disease with heart failure: Secondary | ICD-10-CM | POA: Diagnosis present

## 2016-07-19 DIAGNOSIS — E890 Postprocedural hypothyroidism: Secondary | ICD-10-CM | POA: Diagnosis present

## 2016-07-19 DIAGNOSIS — I130 Hypertensive heart and chronic kidney disease with heart failure and stage 1 through stage 4 chronic kidney disease, or unspecified chronic kidney disease: Secondary | ICD-10-CM | POA: Diagnosis not present

## 2016-07-19 DIAGNOSIS — H9201 Otalgia, right ear: Secondary | ICD-10-CM | POA: Diagnosis present

## 2016-07-19 DIAGNOSIS — D72829 Elevated white blood cell count, unspecified: Secondary | ICD-10-CM | POA: Diagnosis not present

## 2016-07-19 DIAGNOSIS — I480 Paroxysmal atrial fibrillation: Secondary | ICD-10-CM | POA: Diagnosis present

## 2016-07-19 DIAGNOSIS — I2510 Atherosclerotic heart disease of native coronary artery without angina pectoris: Secondary | ICD-10-CM | POA: Diagnosis present

## 2016-07-19 DIAGNOSIS — I083 Combined rheumatic disorders of mitral, aortic and tricuspid valves: Secondary | ICD-10-CM | POA: Diagnosis present

## 2016-07-19 DIAGNOSIS — J181 Lobar pneumonia, unspecified organism: Secondary | ICD-10-CM | POA: Diagnosis not present

## 2016-07-19 DIAGNOSIS — I10 Essential (primary) hypertension: Secondary | ICD-10-CM | POA: Diagnosis not present

## 2016-07-19 DIAGNOSIS — F419 Anxiety disorder, unspecified: Secondary | ICD-10-CM | POA: Diagnosis not present

## 2016-07-19 DIAGNOSIS — E039 Hypothyroidism, unspecified: Secondary | ICD-10-CM | POA: Diagnosis not present

## 2016-07-19 DIAGNOSIS — I4891 Unspecified atrial fibrillation: Secondary | ICD-10-CM | POA: Diagnosis not present

## 2016-07-19 DIAGNOSIS — E785 Hyperlipidemia, unspecified: Secondary | ICD-10-CM | POA: Diagnosis present

## 2016-07-19 DIAGNOSIS — H65191 Other acute nonsuppurative otitis media, right ear: Secondary | ICD-10-CM | POA: Diagnosis not present

## 2016-07-19 DIAGNOSIS — R0602 Shortness of breath: Secondary | ICD-10-CM | POA: Diagnosis not present

## 2016-07-28 DIAGNOSIS — R791 Abnormal coagulation profile: Secondary | ICD-10-CM | POA: Diagnosis not present

## 2016-08-04 DIAGNOSIS — I480 Paroxysmal atrial fibrillation: Secondary | ICD-10-CM | POA: Diagnosis not present

## 2016-08-04 DIAGNOSIS — J40 Bronchitis, not specified as acute or chronic: Secondary | ICD-10-CM | POA: Diagnosis not present

## 2016-08-04 DIAGNOSIS — I10 Essential (primary) hypertension: Secondary | ICD-10-CM | POA: Diagnosis not present

## 2016-08-04 DIAGNOSIS — E890 Postprocedural hypothyroidism: Secondary | ICD-10-CM | POA: Diagnosis not present

## 2016-08-04 DIAGNOSIS — E784 Other hyperlipidemia: Secondary | ICD-10-CM | POA: Diagnosis not present

## 2016-08-04 DIAGNOSIS — I4891 Unspecified atrial fibrillation: Secondary | ICD-10-CM | POA: Diagnosis not present

## 2016-08-04 DIAGNOSIS — Z9889 Other specified postprocedural states: Secondary | ICD-10-CM | POA: Diagnosis not present

## 2016-08-06 DIAGNOSIS — Z79899 Other long term (current) drug therapy: Secondary | ICD-10-CM | POA: Diagnosis not present

## 2016-08-06 DIAGNOSIS — E669 Obesity, unspecified: Secondary | ICD-10-CM | POA: Diagnosis not present

## 2016-08-06 DIAGNOSIS — I10 Essential (primary) hypertension: Secondary | ICD-10-CM | POA: Diagnosis not present

## 2016-08-06 DIAGNOSIS — E559 Vitamin D deficiency, unspecified: Secondary | ICD-10-CM | POA: Diagnosis not present

## 2016-08-06 DIAGNOSIS — Z9181 History of falling: Secondary | ICD-10-CM | POA: Diagnosis not present

## 2016-08-06 DIAGNOSIS — Z6835 Body mass index (BMI) 35.0-35.9, adult: Secondary | ICD-10-CM | POA: Diagnosis not present

## 2016-08-06 DIAGNOSIS — J309 Allergic rhinitis, unspecified: Secondary | ICD-10-CM | POA: Diagnosis not present

## 2016-08-06 DIAGNOSIS — J411 Mucopurulent chronic bronchitis: Secondary | ICD-10-CM | POA: Diagnosis not present

## 2016-08-06 DIAGNOSIS — E039 Hypothyroidism, unspecified: Secondary | ICD-10-CM | POA: Diagnosis not present

## 2016-08-06 DIAGNOSIS — Z09 Encounter for follow-up examination after completed treatment for conditions other than malignant neoplasm: Secondary | ICD-10-CM | POA: Diagnosis not present

## 2016-08-06 DIAGNOSIS — I4891 Unspecified atrial fibrillation: Secondary | ICD-10-CM | POA: Diagnosis not present

## 2016-08-06 DIAGNOSIS — Z7901 Long term (current) use of anticoagulants: Secondary | ICD-10-CM | POA: Diagnosis not present

## 2016-08-12 DIAGNOSIS — R791 Abnormal coagulation profile: Secondary | ICD-10-CM | POA: Diagnosis not present

## 2016-08-18 DIAGNOSIS — R791 Abnormal coagulation profile: Secondary | ICD-10-CM | POA: Diagnosis not present

## 2016-08-24 DIAGNOSIS — R0602 Shortness of breath: Secondary | ICD-10-CM | POA: Diagnosis not present

## 2016-08-24 DIAGNOSIS — R002 Palpitations: Secondary | ICD-10-CM | POA: Diagnosis not present

## 2016-08-24 DIAGNOSIS — R42 Dizziness and giddiness: Secondary | ICD-10-CM | POA: Diagnosis not present

## 2016-08-24 DIAGNOSIS — Z952 Presence of prosthetic heart valve: Secondary | ICD-10-CM | POA: Diagnosis not present

## 2016-08-24 DIAGNOSIS — E669 Obesity, unspecified: Secondary | ICD-10-CM | POA: Diagnosis not present

## 2016-08-24 DIAGNOSIS — I5022 Chronic systolic (congestive) heart failure: Secondary | ICD-10-CM | POA: Diagnosis not present

## 2016-08-24 DIAGNOSIS — J449 Chronic obstructive pulmonary disease, unspecified: Secondary | ICD-10-CM | POA: Diagnosis not present

## 2016-08-24 DIAGNOSIS — I119 Hypertensive heart disease without heart failure: Secondary | ICD-10-CM | POA: Diagnosis not present

## 2016-08-24 DIAGNOSIS — E039 Hypothyroidism, unspecified: Secondary | ICD-10-CM | POA: Diagnosis not present

## 2016-08-24 DIAGNOSIS — I110 Hypertensive heart disease with heart failure: Secondary | ICD-10-CM | POA: Diagnosis not present

## 2016-08-24 DIAGNOSIS — I4891 Unspecified atrial fibrillation: Secondary | ICD-10-CM | POA: Diagnosis not present

## 2016-08-24 DIAGNOSIS — R51 Headache: Secondary | ICD-10-CM | POA: Diagnosis not present

## 2016-08-24 DIAGNOSIS — I481 Persistent atrial fibrillation: Secondary | ICD-10-CM | POA: Diagnosis not present

## 2016-08-24 DIAGNOSIS — I4892 Unspecified atrial flutter: Secondary | ICD-10-CM | POA: Diagnosis not present

## 2016-08-24 DIAGNOSIS — E038 Other specified hypothyroidism: Secondary | ICD-10-CM | POA: Diagnosis not present

## 2016-08-24 DIAGNOSIS — E871 Hypo-osmolality and hyponatremia: Secondary | ICD-10-CM | POA: Diagnosis not present

## 2016-08-25 DIAGNOSIS — I444 Left anterior fascicular block: Secondary | ICD-10-CM | POA: Diagnosis not present

## 2016-08-25 DIAGNOSIS — Z780 Asymptomatic menopausal state: Secondary | ICD-10-CM | POA: Diagnosis not present

## 2016-08-25 DIAGNOSIS — I499 Cardiac arrhythmia, unspecified: Secondary | ICD-10-CM | POA: Diagnosis not present

## 2016-08-25 DIAGNOSIS — I471 Supraventricular tachycardia: Secondary | ICD-10-CM | POA: Diagnosis not present

## 2016-08-25 DIAGNOSIS — R000 Tachycardia, unspecified: Secondary | ICD-10-CM | POA: Diagnosis not present

## 2016-08-25 DIAGNOSIS — F419 Anxiety disorder, unspecified: Secondary | ICD-10-CM | POA: Diagnosis present

## 2016-08-25 DIAGNOSIS — R0600 Dyspnea, unspecified: Secondary | ICD-10-CM | POA: Diagnosis not present

## 2016-08-25 DIAGNOSIS — R42 Dizziness and giddiness: Secondary | ICD-10-CM | POA: Diagnosis present

## 2016-08-25 DIAGNOSIS — I119 Hypertensive heart disease without heart failure: Secondary | ICD-10-CM | POA: Diagnosis not present

## 2016-08-25 DIAGNOSIS — I5022 Chronic systolic (congestive) heart failure: Secondary | ICD-10-CM | POA: Diagnosis present

## 2016-08-25 DIAGNOSIS — E871 Hypo-osmolality and hyponatremia: Secondary | ICD-10-CM | POA: Diagnosis present

## 2016-08-25 DIAGNOSIS — I517 Cardiomegaly: Secondary | ICD-10-CM | POA: Diagnosis not present

## 2016-08-25 DIAGNOSIS — I4892 Unspecified atrial flutter: Secondary | ICD-10-CM | POA: Diagnosis not present

## 2016-08-25 DIAGNOSIS — E785 Hyperlipidemia, unspecified: Secondary | ICD-10-CM | POA: Diagnosis present

## 2016-08-25 DIAGNOSIS — E038 Other specified hypothyroidism: Secondary | ICD-10-CM | POA: Diagnosis not present

## 2016-08-25 DIAGNOSIS — I10 Essential (primary) hypertension: Secondary | ICD-10-CM | POA: Diagnosis not present

## 2016-08-25 DIAGNOSIS — J449 Chronic obstructive pulmonary disease, unspecified: Secondary | ICD-10-CM | POA: Diagnosis present

## 2016-08-25 DIAGNOSIS — I340 Nonrheumatic mitral (valve) insufficiency: Secondary | ICD-10-CM | POA: Diagnosis not present

## 2016-08-25 DIAGNOSIS — I341 Nonrheumatic mitral (valve) prolapse: Secondary | ICD-10-CM | POA: Diagnosis not present

## 2016-08-25 DIAGNOSIS — J189 Pneumonia, unspecified organism: Secondary | ICD-10-CM | POA: Diagnosis not present

## 2016-08-25 DIAGNOSIS — Z952 Presence of prosthetic heart valve: Secondary | ICD-10-CM | POA: Diagnosis not present

## 2016-08-25 DIAGNOSIS — Z6835 Body mass index (BMI) 35.0-35.9, adult: Secondary | ICD-10-CM | POA: Diagnosis not present

## 2016-08-25 DIAGNOSIS — I252 Old myocardial infarction: Secondary | ICD-10-CM | POA: Diagnosis not present

## 2016-08-25 DIAGNOSIS — I348 Other nonrheumatic mitral valve disorders: Secondary | ICD-10-CM | POA: Diagnosis not present

## 2016-08-25 DIAGNOSIS — E039 Hypothyroidism, unspecified: Secondary | ICD-10-CM | POA: Diagnosis present

## 2016-08-25 DIAGNOSIS — H9201 Otalgia, right ear: Secondary | ICD-10-CM | POA: Diagnosis present

## 2016-08-25 DIAGNOSIS — I481 Persistent atrial fibrillation: Secondary | ICD-10-CM | POA: Diagnosis present

## 2016-08-25 DIAGNOSIS — R9431 Abnormal electrocardiogram [ECG] [EKG]: Secondary | ICD-10-CM | POA: Diagnosis not present

## 2016-08-25 DIAGNOSIS — I4439 Other atrioventricular block: Secondary | ICD-10-CM | POA: Diagnosis not present

## 2016-08-25 DIAGNOSIS — I501 Left ventricular failure: Secondary | ICD-10-CM | POA: Diagnosis not present

## 2016-08-25 DIAGNOSIS — Z9889 Other specified postprocedural states: Secondary | ICD-10-CM | POA: Diagnosis not present

## 2016-08-25 DIAGNOSIS — I110 Hypertensive heart disease with heart failure: Secondary | ICD-10-CM | POA: Diagnosis present

## 2016-08-25 DIAGNOSIS — I4891 Unspecified atrial fibrillation: Secondary | ICD-10-CM | POA: Diagnosis not present

## 2016-08-25 DIAGNOSIS — E669 Obesity, unspecified: Secondary | ICD-10-CM | POA: Diagnosis present

## 2016-08-25 DIAGNOSIS — E784 Other hyperlipidemia: Secondary | ICD-10-CM | POA: Diagnosis not present

## 2016-08-25 DIAGNOSIS — Z8701 Personal history of pneumonia (recurrent): Secondary | ICD-10-CM | POA: Diagnosis not present

## 2016-08-25 DIAGNOSIS — I2119 ST elevation (STEMI) myocardial infarction involving other coronary artery of inferior wall: Secondary | ICD-10-CM | POA: Diagnosis not present

## 2016-09-01 DIAGNOSIS — Z9889 Other specified postprocedural states: Secondary | ICD-10-CM | POA: Diagnosis not present

## 2016-09-01 DIAGNOSIS — I10 Essential (primary) hypertension: Secondary | ICD-10-CM | POA: Diagnosis not present

## 2016-09-01 DIAGNOSIS — E890 Postprocedural hypothyroidism: Secondary | ICD-10-CM | POA: Diagnosis not present

## 2016-09-01 DIAGNOSIS — I4891 Unspecified atrial fibrillation: Secondary | ICD-10-CM | POA: Diagnosis not present

## 2016-09-01 DIAGNOSIS — I2510 Atherosclerotic heart disease of native coronary artery without angina pectoris: Secondary | ICD-10-CM | POA: Diagnosis not present

## 2016-09-02 DIAGNOSIS — I4891 Unspecified atrial fibrillation: Secondary | ICD-10-CM | POA: Diagnosis not present

## 2016-09-02 DIAGNOSIS — I10 Essential (primary) hypertension: Secondary | ICD-10-CM | POA: Diagnosis not present

## 2016-09-02 DIAGNOSIS — E039 Hypothyroidism, unspecified: Secondary | ICD-10-CM | POA: Diagnosis not present

## 2016-09-02 DIAGNOSIS — Z09 Encounter for follow-up examination after completed treatment for conditions other than malignant neoplasm: Secondary | ICD-10-CM | POA: Diagnosis not present

## 2016-09-02 DIAGNOSIS — Z6834 Body mass index (BMI) 34.0-34.9, adult: Secondary | ICD-10-CM | POA: Diagnosis not present

## 2016-09-02 DIAGNOSIS — R791 Abnormal coagulation profile: Secondary | ICD-10-CM | POA: Diagnosis not present

## 2016-09-09 DIAGNOSIS — R791 Abnormal coagulation profile: Secondary | ICD-10-CM | POA: Diagnosis not present

## 2016-09-23 DIAGNOSIS — R791 Abnormal coagulation profile: Secondary | ICD-10-CM | POA: Diagnosis not present

## 2016-09-28 DIAGNOSIS — Z9889 Other specified postprocedural states: Secondary | ICD-10-CM | POA: Diagnosis not present

## 2016-09-28 DIAGNOSIS — R062 Wheezing: Secondary | ICD-10-CM | POA: Diagnosis not present

## 2016-09-28 DIAGNOSIS — R0602 Shortness of breath: Secondary | ICD-10-CM | POA: Diagnosis not present

## 2016-09-28 DIAGNOSIS — E890 Postprocedural hypothyroidism: Secondary | ICD-10-CM | POA: Diagnosis not present

## 2016-09-28 DIAGNOSIS — I480 Paroxysmal atrial fibrillation: Secondary | ICD-10-CM | POA: Diagnosis not present

## 2016-09-28 DIAGNOSIS — I340 Nonrheumatic mitral (valve) insufficiency: Secondary | ICD-10-CM | POA: Diagnosis not present

## 2016-09-30 DIAGNOSIS — I480 Paroxysmal atrial fibrillation: Secondary | ICD-10-CM | POA: Diagnosis not present

## 2016-09-30 DIAGNOSIS — I712 Thoracic aortic aneurysm, without rupture: Secondary | ICD-10-CM | POA: Diagnosis not present

## 2016-09-30 DIAGNOSIS — I340 Nonrheumatic mitral (valve) insufficiency: Secondary | ICD-10-CM | POA: Diagnosis not present

## 2016-09-30 DIAGNOSIS — I4891 Unspecified atrial fibrillation: Secondary | ICD-10-CM | POA: Diagnosis not present

## 2016-10-01 DIAGNOSIS — J9811 Atelectasis: Secondary | ICD-10-CM | POA: Diagnosis not present

## 2016-10-01 DIAGNOSIS — I252 Old myocardial infarction: Secondary | ICD-10-CM | POA: Diagnosis not present

## 2016-10-01 DIAGNOSIS — I481 Persistent atrial fibrillation: Secondary | ICD-10-CM | POA: Diagnosis not present

## 2016-10-01 DIAGNOSIS — J441 Chronic obstructive pulmonary disease with (acute) exacerbation: Secondary | ICD-10-CM | POA: Diagnosis not present

## 2016-10-01 DIAGNOSIS — J90 Pleural effusion, not elsewhere classified: Secondary | ICD-10-CM | POA: Diagnosis not present

## 2016-10-01 DIAGNOSIS — I340 Nonrheumatic mitral (valve) insufficiency: Secondary | ICD-10-CM | POA: Diagnosis not present

## 2016-10-01 DIAGNOSIS — I471 Supraventricular tachycardia: Secondary | ICD-10-CM | POA: Diagnosis not present

## 2016-10-01 DIAGNOSIS — J440 Chronic obstructive pulmonary disease with acute lower respiratory infection: Secondary | ICD-10-CM | POA: Diagnosis not present

## 2016-10-01 DIAGNOSIS — I509 Heart failure, unspecified: Secondary | ICD-10-CM | POA: Diagnosis not present

## 2016-10-01 DIAGNOSIS — I495 Sick sinus syndrome: Secondary | ICD-10-CM | POA: Diagnosis not present

## 2016-10-01 DIAGNOSIS — E039 Hypothyroidism, unspecified: Secondary | ICD-10-CM | POA: Diagnosis not present

## 2016-10-01 DIAGNOSIS — I480 Paroxysmal atrial fibrillation: Secondary | ICD-10-CM | POA: Diagnosis not present

## 2016-10-01 DIAGNOSIS — I110 Hypertensive heart disease with heart failure: Secondary | ICD-10-CM | POA: Diagnosis not present

## 2016-10-02 DIAGNOSIS — I252 Old myocardial infarction: Secondary | ICD-10-CM | POA: Diagnosis not present

## 2016-10-02 DIAGNOSIS — R9431 Abnormal electrocardiogram [ECG] [EKG]: Secondary | ICD-10-CM | POA: Diagnosis not present

## 2016-10-02 DIAGNOSIS — I340 Nonrheumatic mitral (valve) insufficiency: Secondary | ICD-10-CM | POA: Diagnosis not present

## 2016-10-02 DIAGNOSIS — J90 Pleural effusion, not elsewhere classified: Secondary | ICD-10-CM | POA: Diagnosis not present

## 2016-10-02 DIAGNOSIS — I10 Essential (primary) hypertension: Secondary | ICD-10-CM | POA: Diagnosis not present

## 2016-10-02 DIAGNOSIS — Z952 Presence of prosthetic heart valve: Secondary | ICD-10-CM | POA: Diagnosis not present

## 2016-10-02 DIAGNOSIS — I4891 Unspecified atrial fibrillation: Secondary | ICD-10-CM | POA: Diagnosis not present

## 2016-10-03 DIAGNOSIS — I340 Nonrheumatic mitral (valve) insufficiency: Secondary | ICD-10-CM | POA: Diagnosis not present

## 2016-10-03 DIAGNOSIS — Z952 Presence of prosthetic heart valve: Secondary | ICD-10-CM | POA: Diagnosis not present

## 2016-10-03 DIAGNOSIS — R05 Cough: Secondary | ICD-10-CM | POA: Diagnosis not present

## 2016-10-03 DIAGNOSIS — J449 Chronic obstructive pulmonary disease, unspecified: Secondary | ICD-10-CM | POA: Diagnosis not present

## 2016-10-03 DIAGNOSIS — I4891 Unspecified atrial fibrillation: Secondary | ICD-10-CM | POA: Diagnosis not present

## 2016-10-04 DIAGNOSIS — R0781 Pleurodynia: Secondary | ICD-10-CM | POA: Diagnosis present

## 2016-10-04 DIAGNOSIS — I501 Left ventricular failure: Secondary | ICD-10-CM | POA: Diagnosis not present

## 2016-10-04 DIAGNOSIS — R0902 Hypoxemia: Secondary | ICD-10-CM | POA: Diagnosis not present

## 2016-10-04 DIAGNOSIS — J441 Chronic obstructive pulmonary disease with (acute) exacerbation: Secondary | ICD-10-CM | POA: Diagnosis not present

## 2016-10-04 DIAGNOSIS — I110 Hypertensive heart disease with heart failure: Secondary | ICD-10-CM | POA: Diagnosis present

## 2016-10-04 DIAGNOSIS — I480 Paroxysmal atrial fibrillation: Secondary | ICD-10-CM | POA: Diagnosis not present

## 2016-10-04 DIAGNOSIS — Z952 Presence of prosthetic heart valve: Secondary | ICD-10-CM | POA: Diagnosis not present

## 2016-10-04 DIAGNOSIS — R9431 Abnormal electrocardiogram [ECG] [EKG]: Secondary | ICD-10-CM | POA: Diagnosis not present

## 2016-10-04 DIAGNOSIS — J45901 Unspecified asthma with (acute) exacerbation: Secondary | ICD-10-CM | POA: Diagnosis not present

## 2016-10-04 DIAGNOSIS — I481 Persistent atrial fibrillation: Secondary | ICD-10-CM | POA: Diagnosis present

## 2016-10-04 DIAGNOSIS — I495 Sick sinus syndrome: Secondary | ICD-10-CM | POA: Diagnosis present

## 2016-10-04 DIAGNOSIS — R062 Wheezing: Secondary | ICD-10-CM | POA: Diagnosis not present

## 2016-10-04 DIAGNOSIS — R0602 Shortness of breath: Secondary | ICD-10-CM | POA: Diagnosis not present

## 2016-10-04 DIAGNOSIS — D72829 Elevated white blood cell count, unspecified: Secondary | ICD-10-CM | POA: Diagnosis not present

## 2016-10-04 DIAGNOSIS — J9811 Atelectasis: Secondary | ICD-10-CM | POA: Diagnosis present

## 2016-10-04 DIAGNOSIS — I471 Supraventricular tachycardia: Secondary | ICD-10-CM | POA: Diagnosis present

## 2016-10-04 DIAGNOSIS — J209 Acute bronchitis, unspecified: Secondary | ICD-10-CM | POA: Diagnosis present

## 2016-10-04 DIAGNOSIS — I2510 Atherosclerotic heart disease of native coronary artery without angina pectoris: Secondary | ICD-10-CM | POA: Diagnosis present

## 2016-10-04 DIAGNOSIS — J449 Chronic obstructive pulmonary disease, unspecified: Secondary | ICD-10-CM | POA: Diagnosis not present

## 2016-10-04 DIAGNOSIS — F419 Anxiety disorder, unspecified: Secondary | ICD-10-CM | POA: Diagnosis present

## 2016-10-04 DIAGNOSIS — R0681 Apnea, not elsewhere classified: Secondary | ICD-10-CM | POA: Diagnosis present

## 2016-10-04 DIAGNOSIS — J90 Pleural effusion, not elsewhere classified: Secondary | ICD-10-CM | POA: Diagnosis present

## 2016-10-04 DIAGNOSIS — E876 Hypokalemia: Secondary | ICD-10-CM | POA: Diagnosis not present

## 2016-10-04 DIAGNOSIS — I4891 Unspecified atrial fibrillation: Secondary | ICD-10-CM | POA: Diagnosis not present

## 2016-10-04 DIAGNOSIS — Z8679 Personal history of other diseases of the circulatory system: Secondary | ICD-10-CM | POA: Diagnosis not present

## 2016-10-04 DIAGNOSIS — I509 Heart failure, unspecified: Secondary | ICD-10-CM | POA: Diagnosis present

## 2016-10-04 DIAGNOSIS — I252 Old myocardial infarction: Secondary | ICD-10-CM | POA: Diagnosis not present

## 2016-10-04 DIAGNOSIS — J45909 Unspecified asthma, uncomplicated: Secondary | ICD-10-CM | POA: Diagnosis not present

## 2016-10-04 DIAGNOSIS — E039 Hypothyroidism, unspecified: Secondary | ICD-10-CM | POA: Diagnosis present

## 2016-10-04 DIAGNOSIS — Z7901 Long term (current) use of anticoagulants: Secondary | ICD-10-CM | POA: Diagnosis not present

## 2016-10-04 DIAGNOSIS — J440 Chronic obstructive pulmonary disease with acute lower respiratory infection: Secondary | ICD-10-CM | POA: Diagnosis present

## 2016-10-04 DIAGNOSIS — I340 Nonrheumatic mitral (valve) insufficiency: Secondary | ICD-10-CM | POA: Diagnosis not present

## 2016-10-04 DIAGNOSIS — I491 Atrial premature depolarization: Secondary | ICD-10-CM | POA: Diagnosis not present

## 2016-10-04 DIAGNOSIS — I493 Ventricular premature depolarization: Secondary | ICD-10-CM | POA: Diagnosis not present

## 2016-10-14 DIAGNOSIS — F419 Anxiety disorder, unspecified: Secondary | ICD-10-CM | POA: Diagnosis not present

## 2016-10-14 DIAGNOSIS — Z6835 Body mass index (BMI) 35.0-35.9, adult: Secondary | ICD-10-CM | POA: Diagnosis not present

## 2016-10-14 DIAGNOSIS — E039 Hypothyroidism, unspecified: Secondary | ICD-10-CM | POA: Diagnosis not present

## 2016-10-14 DIAGNOSIS — I10 Essential (primary) hypertension: Secondary | ICD-10-CM | POA: Diagnosis not present

## 2016-10-14 DIAGNOSIS — R600 Localized edema: Secondary | ICD-10-CM | POA: Diagnosis not present

## 2016-10-14 DIAGNOSIS — R791 Abnormal coagulation profile: Secondary | ICD-10-CM | POA: Diagnosis not present

## 2016-10-14 DIAGNOSIS — I4891 Unspecified atrial fibrillation: Secondary | ICD-10-CM | POA: Diagnosis not present

## 2016-10-14 DIAGNOSIS — Z79899 Other long term (current) drug therapy: Secondary | ICD-10-CM | POA: Diagnosis not present

## 2016-10-20 DIAGNOSIS — Z79899 Other long term (current) drug therapy: Secondary | ICD-10-CM | POA: Diagnosis not present

## 2016-10-20 DIAGNOSIS — I480 Paroxysmal atrial fibrillation: Secondary | ICD-10-CM | POA: Diagnosis not present

## 2016-10-20 DIAGNOSIS — Z8679 Personal history of other diseases of the circulatory system: Secondary | ICD-10-CM | POA: Diagnosis not present

## 2016-10-20 DIAGNOSIS — E890 Postprocedural hypothyroidism: Secondary | ICD-10-CM | POA: Diagnosis not present

## 2016-10-20 DIAGNOSIS — I4891 Unspecified atrial fibrillation: Secondary | ICD-10-CM | POA: Diagnosis not present

## 2016-10-20 DIAGNOSIS — Z9889 Other specified postprocedural states: Secondary | ICD-10-CM | POA: Diagnosis not present

## 2016-10-20 DIAGNOSIS — I10 Essential (primary) hypertension: Secondary | ICD-10-CM | POA: Diagnosis not present

## 2016-10-20 DIAGNOSIS — R791 Abnormal coagulation profile: Secondary | ICD-10-CM | POA: Diagnosis not present

## 2016-10-20 DIAGNOSIS — Z6833 Body mass index (BMI) 33.0-33.9, adult: Secondary | ICD-10-CM | POA: Diagnosis not present

## 2016-10-20 DIAGNOSIS — E669 Obesity, unspecified: Secondary | ICD-10-CM | POA: Diagnosis not present

## 2016-10-20 DIAGNOSIS — E039 Hypothyroidism, unspecified: Secondary | ICD-10-CM | POA: Diagnosis not present

## 2016-10-26 DIAGNOSIS — I340 Nonrheumatic mitral (valve) insufficiency: Secondary | ICD-10-CM | POA: Diagnosis not present

## 2016-10-26 DIAGNOSIS — R05 Cough: Secondary | ICD-10-CM | POA: Diagnosis not present

## 2016-10-26 DIAGNOSIS — R062 Wheezing: Secondary | ICD-10-CM | POA: Diagnosis not present

## 2016-10-26 DIAGNOSIS — R918 Other nonspecific abnormal finding of lung field: Secondary | ICD-10-CM | POA: Diagnosis not present

## 2016-10-26 DIAGNOSIS — I480 Paroxysmal atrial fibrillation: Secondary | ICD-10-CM | POA: Diagnosis not present

## 2016-10-26 DIAGNOSIS — K219 Gastro-esophageal reflux disease without esophagitis: Secondary | ICD-10-CM | POA: Diagnosis not present

## 2016-10-27 DIAGNOSIS — R7989 Other specified abnormal findings of blood chemistry: Secondary | ICD-10-CM | POA: Diagnosis not present

## 2016-10-27 DIAGNOSIS — R791 Abnormal coagulation profile: Secondary | ICD-10-CM | POA: Diagnosis not present

## 2016-11-03 DIAGNOSIS — R791 Abnormal coagulation profile: Secondary | ICD-10-CM | POA: Diagnosis not present

## 2016-11-03 DIAGNOSIS — R7989 Other specified abnormal findings of blood chemistry: Secondary | ICD-10-CM | POA: Diagnosis not present

## 2016-11-10 DIAGNOSIS — I4891 Unspecified atrial fibrillation: Secondary | ICD-10-CM | POA: Diagnosis not present

## 2016-11-10 DIAGNOSIS — R791 Abnormal coagulation profile: Secondary | ICD-10-CM | POA: Diagnosis not present

## 2016-11-10 DIAGNOSIS — H68009 Unspecified Eustachian salpingitis, unspecified ear: Secondary | ICD-10-CM | POA: Diagnosis not present

## 2016-11-10 DIAGNOSIS — J019 Acute sinusitis, unspecified: Secondary | ICD-10-CM | POA: Diagnosis not present

## 2016-11-10 DIAGNOSIS — E669 Obesity, unspecified: Secondary | ICD-10-CM | POA: Diagnosis not present

## 2016-11-10 DIAGNOSIS — Z6834 Body mass index (BMI) 34.0-34.9, adult: Secondary | ICD-10-CM | POA: Diagnosis not present

## 2016-11-11 DIAGNOSIS — R0602 Shortness of breath: Secondary | ICD-10-CM | POA: Diagnosis not present

## 2016-11-23 DIAGNOSIS — E559 Vitamin D deficiency, unspecified: Secondary | ICD-10-CM | POA: Diagnosis not present

## 2016-11-23 DIAGNOSIS — N183 Chronic kidney disease, stage 3 (moderate): Secondary | ICD-10-CM | POA: Diagnosis not present

## 2016-11-23 DIAGNOSIS — R109 Unspecified abdominal pain: Secondary | ICD-10-CM | POA: Diagnosis not present

## 2016-11-23 DIAGNOSIS — G8929 Other chronic pain: Secondary | ICD-10-CM | POA: Diagnosis not present

## 2016-11-23 DIAGNOSIS — N179 Acute kidney failure, unspecified: Secondary | ICD-10-CM | POA: Diagnosis not present

## 2016-11-23 DIAGNOSIS — I4891 Unspecified atrial fibrillation: Secondary | ICD-10-CM | POA: Diagnosis not present

## 2016-11-23 DIAGNOSIS — Z9889 Other specified postprocedural states: Secondary | ICD-10-CM | POA: Diagnosis not present

## 2016-11-23 DIAGNOSIS — R3129 Other microscopic hematuria: Secondary | ICD-10-CM | POA: Diagnosis not present

## 2016-11-23 DIAGNOSIS — R0602 Shortness of breath: Secondary | ICD-10-CM | POA: Diagnosis not present

## 2016-11-23 DIAGNOSIS — N390 Urinary tract infection, site not specified: Secondary | ICD-10-CM | POA: Diagnosis not present

## 2016-11-23 DIAGNOSIS — Z0389 Encounter for observation for other suspected diseases and conditions ruled out: Secondary | ICD-10-CM | POA: Diagnosis not present

## 2016-11-24 DIAGNOSIS — I10 Essential (primary) hypertension: Secondary | ICD-10-CM | POA: Diagnosis not present

## 2016-11-24 DIAGNOSIS — Z8679 Personal history of other diseases of the circulatory system: Secondary | ICD-10-CM | POA: Diagnosis not present

## 2016-11-24 DIAGNOSIS — Z9889 Other specified postprocedural states: Secondary | ICD-10-CM | POA: Diagnosis not present

## 2016-11-24 DIAGNOSIS — Z7901 Long term (current) use of anticoagulants: Secondary | ICD-10-CM | POA: Diagnosis not present

## 2016-11-24 DIAGNOSIS — I480 Paroxysmal atrial fibrillation: Secondary | ICD-10-CM | POA: Diagnosis not present

## 2016-12-02 DIAGNOSIS — I480 Paroxysmal atrial fibrillation: Secondary | ICD-10-CM | POA: Diagnosis not present

## 2016-12-04 DIAGNOSIS — I480 Paroxysmal atrial fibrillation: Secondary | ICD-10-CM | POA: Diagnosis not present

## 2016-12-08 DIAGNOSIS — Z7901 Long term (current) use of anticoagulants: Secondary | ICD-10-CM | POA: Diagnosis not present

## 2016-12-15 DIAGNOSIS — J019 Acute sinusitis, unspecified: Secondary | ICD-10-CM | POA: Diagnosis not present

## 2016-12-15 DIAGNOSIS — J441 Chronic obstructive pulmonary disease with (acute) exacerbation: Secondary | ICD-10-CM | POA: Diagnosis not present

## 2016-12-15 DIAGNOSIS — I495 Sick sinus syndrome: Secondary | ICD-10-CM | POA: Diagnosis not present

## 2016-12-15 DIAGNOSIS — R791 Abnormal coagulation profile: Secondary | ICD-10-CM | POA: Diagnosis not present

## 2016-12-15 DIAGNOSIS — I10 Essential (primary) hypertension: Secondary | ICD-10-CM | POA: Diagnosis not present

## 2016-12-15 DIAGNOSIS — I4891 Unspecified atrial fibrillation: Secondary | ICD-10-CM | POA: Diagnosis not present

## 2016-12-15 DIAGNOSIS — B372 Candidiasis of skin and nail: Secondary | ICD-10-CM | POA: Diagnosis not present

## 2016-12-15 DIAGNOSIS — R300 Dysuria: Secondary | ICD-10-CM | POA: Diagnosis not present

## 2016-12-15 DIAGNOSIS — E039 Hypothyroidism, unspecified: Secondary | ICD-10-CM | POA: Diagnosis not present

## 2016-12-15 DIAGNOSIS — Z9889 Other specified postprocedural states: Secondary | ICD-10-CM | POA: Diagnosis not present

## 2016-12-15 DIAGNOSIS — E784 Other hyperlipidemia: Secondary | ICD-10-CM | POA: Diagnosis not present

## 2016-12-15 DIAGNOSIS — Z79899 Other long term (current) drug therapy: Secondary | ICD-10-CM | POA: Diagnosis not present

## 2016-12-15 DIAGNOSIS — N390 Urinary tract infection, site not specified: Secondary | ICD-10-CM | POA: Diagnosis not present

## 2016-12-16 DIAGNOSIS — J309 Allergic rhinitis, unspecified: Secondary | ICD-10-CM | POA: Diagnosis not present

## 2016-12-16 DIAGNOSIS — H6983 Other specified disorders of Eustachian tube, bilateral: Secondary | ICD-10-CM | POA: Diagnosis not present

## 2016-12-16 DIAGNOSIS — Z79899 Other long term (current) drug therapy: Secondary | ICD-10-CM | POA: Diagnosis not present

## 2016-12-16 DIAGNOSIS — E039 Hypothyroidism, unspecified: Secondary | ICD-10-CM | POA: Diagnosis not present

## 2016-12-17 DIAGNOSIS — I480 Paroxysmal atrial fibrillation: Secondary | ICD-10-CM | POA: Diagnosis not present

## 2016-12-29 DIAGNOSIS — Z7901 Long term (current) use of anticoagulants: Secondary | ICD-10-CM | POA: Diagnosis not present

## 2017-01-13 DIAGNOSIS — Z79899 Other long term (current) drug therapy: Secondary | ICD-10-CM | POA: Diagnosis not present

## 2017-01-13 DIAGNOSIS — Z6835 Body mass index (BMI) 35.0-35.9, adult: Secondary | ICD-10-CM | POA: Diagnosis not present

## 2017-01-13 DIAGNOSIS — I10 Essential (primary) hypertension: Secondary | ICD-10-CM | POA: Diagnosis not present

## 2017-01-13 DIAGNOSIS — Z9181 History of falling: Secondary | ICD-10-CM | POA: Diagnosis not present

## 2017-01-13 DIAGNOSIS — F39 Unspecified mood [affective] disorder: Secondary | ICD-10-CM | POA: Diagnosis not present

## 2017-01-13 DIAGNOSIS — N760 Acute vaginitis: Secondary | ICD-10-CM | POA: Diagnosis not present

## 2017-01-13 DIAGNOSIS — I4891 Unspecified atrial fibrillation: Secondary | ICD-10-CM | POA: Diagnosis not present

## 2017-01-13 DIAGNOSIS — E669 Obesity, unspecified: Secondary | ICD-10-CM | POA: Diagnosis not present

## 2017-01-13 DIAGNOSIS — R791 Abnormal coagulation profile: Secondary | ICD-10-CM | POA: Diagnosis not present

## 2017-01-13 DIAGNOSIS — Z1389 Encounter for screening for other disorder: Secondary | ICD-10-CM | POA: Diagnosis not present

## 2017-01-15 DIAGNOSIS — I480 Paroxysmal atrial fibrillation: Secondary | ICD-10-CM | POA: Diagnosis not present

## 2017-01-20 DIAGNOSIS — Z7901 Long term (current) use of anticoagulants: Secondary | ICD-10-CM | POA: Diagnosis not present

## 2017-01-20 DIAGNOSIS — Z6834 Body mass index (BMI) 34.0-34.9, adult: Secondary | ICD-10-CM | POA: Diagnosis not present

## 2017-01-20 DIAGNOSIS — J019 Acute sinusitis, unspecified: Secondary | ICD-10-CM | POA: Diagnosis not present

## 2017-01-20 DIAGNOSIS — J309 Allergic rhinitis, unspecified: Secondary | ICD-10-CM | POA: Diagnosis not present

## 2017-01-20 DIAGNOSIS — I4891 Unspecified atrial fibrillation: Secondary | ICD-10-CM | POA: Diagnosis not present

## 2017-01-20 DIAGNOSIS — J45909 Unspecified asthma, uncomplicated: Secondary | ICD-10-CM | POA: Diagnosis not present

## 2017-01-22 DIAGNOSIS — J209 Acute bronchitis, unspecified: Secondary | ICD-10-CM | POA: Diagnosis not present

## 2017-01-22 DIAGNOSIS — R062 Wheezing: Secondary | ICD-10-CM | POA: Diagnosis not present

## 2017-01-22 DIAGNOSIS — J45901 Unspecified asthma with (acute) exacerbation: Secondary | ICD-10-CM | POA: Diagnosis not present

## 2017-01-22 DIAGNOSIS — I4891 Unspecified atrial fibrillation: Secondary | ICD-10-CM | POA: Diagnosis not present

## 2017-01-22 DIAGNOSIS — E876 Hypokalemia: Secondary | ICD-10-CM | POA: Diagnosis not present

## 2017-01-22 DIAGNOSIS — J441 Chronic obstructive pulmonary disease with (acute) exacerbation: Secondary | ICD-10-CM | POA: Diagnosis not present

## 2017-01-22 DIAGNOSIS — I4892 Unspecified atrial flutter: Secondary | ICD-10-CM | POA: Diagnosis not present

## 2017-01-22 DIAGNOSIS — J440 Chronic obstructive pulmonary disease with acute lower respiratory infection: Secondary | ICD-10-CM | POA: Diagnosis not present

## 2017-01-22 DIAGNOSIS — D72829 Elevated white blood cell count, unspecified: Secondary | ICD-10-CM | POA: Diagnosis not present

## 2017-01-22 DIAGNOSIS — F419 Anxiety disorder, unspecified: Secondary | ICD-10-CM | POA: Diagnosis not present

## 2017-01-22 DIAGNOSIS — R0602 Shortness of breath: Secondary | ICD-10-CM | POA: Diagnosis not present

## 2017-01-22 DIAGNOSIS — Z7901 Long term (current) use of anticoagulants: Secondary | ICD-10-CM | POA: Diagnosis not present

## 2017-01-22 DIAGNOSIS — I2510 Atherosclerotic heart disease of native coronary artery without angina pectoris: Secondary | ICD-10-CM | POA: Diagnosis not present

## 2017-01-22 DIAGNOSIS — I110 Hypertensive heart disease with heart failure: Secondary | ICD-10-CM | POA: Diagnosis not present

## 2017-01-22 DIAGNOSIS — I482 Chronic atrial fibrillation: Secondary | ICD-10-CM | POA: Diagnosis not present

## 2017-01-22 DIAGNOSIS — R0600 Dyspnea, unspecified: Secondary | ICD-10-CM | POA: Diagnosis not present

## 2017-01-22 DIAGNOSIS — J189 Pneumonia, unspecified organism: Secondary | ICD-10-CM | POA: Diagnosis not present

## 2017-01-23 DIAGNOSIS — Z8249 Family history of ischemic heart disease and other diseases of the circulatory system: Secondary | ICD-10-CM | POA: Diagnosis not present

## 2017-01-23 DIAGNOSIS — Z7901 Long term (current) use of anticoagulants: Secondary | ICD-10-CM | POA: Diagnosis not present

## 2017-01-23 DIAGNOSIS — F419 Anxiety disorder, unspecified: Secondary | ICD-10-CM | POA: Diagnosis not present

## 2017-01-23 DIAGNOSIS — I119 Hypertensive heart disease without heart failure: Secondary | ICD-10-CM | POA: Diagnosis not present

## 2017-01-23 DIAGNOSIS — I252 Old myocardial infarction: Secondary | ICD-10-CM | POA: Diagnosis not present

## 2017-01-23 DIAGNOSIS — I4891 Unspecified atrial fibrillation: Secondary | ICD-10-CM | POA: Diagnosis not present

## 2017-01-23 DIAGNOSIS — Z952 Presence of prosthetic heart valve: Secondary | ICD-10-CM | POA: Diagnosis not present

## 2017-01-23 DIAGNOSIS — J209 Acute bronchitis, unspecified: Secondary | ICD-10-CM | POA: Diagnosis not present

## 2017-01-23 DIAGNOSIS — R062 Wheezing: Secondary | ICD-10-CM | POA: Diagnosis not present

## 2017-01-23 DIAGNOSIS — E039 Hypothyroidism, unspecified: Secondary | ICD-10-CM | POA: Diagnosis not present

## 2017-01-23 DIAGNOSIS — I2510 Atherosclerotic heart disease of native coronary artery without angina pectoris: Secondary | ICD-10-CM | POA: Diagnosis not present

## 2017-01-23 DIAGNOSIS — D72829 Elevated white blood cell count, unspecified: Secondary | ICD-10-CM | POA: Diagnosis not present

## 2017-01-23 DIAGNOSIS — E876 Hypokalemia: Secondary | ICD-10-CM | POA: Diagnosis not present

## 2017-01-24 DIAGNOSIS — I10 Essential (primary) hypertension: Secondary | ICD-10-CM | POA: Diagnosis not present

## 2017-01-24 DIAGNOSIS — D72829 Elevated white blood cell count, unspecified: Secondary | ICD-10-CM | POA: Diagnosis not present

## 2017-01-24 DIAGNOSIS — Z952 Presence of prosthetic heart valve: Secondary | ICD-10-CM | POA: Diagnosis not present

## 2017-01-24 DIAGNOSIS — R062 Wheezing: Secondary | ICD-10-CM | POA: Diagnosis not present

## 2017-01-24 DIAGNOSIS — E039 Hypothyroidism, unspecified: Secondary | ICD-10-CM | POA: Diagnosis not present

## 2017-01-24 DIAGNOSIS — R0600 Dyspnea, unspecified: Secondary | ICD-10-CM | POA: Diagnosis not present

## 2017-01-24 DIAGNOSIS — E876 Hypokalemia: Secondary | ICD-10-CM | POA: Diagnosis not present

## 2017-01-24 DIAGNOSIS — Z7901 Long term (current) use of anticoagulants: Secondary | ICD-10-CM | POA: Diagnosis not present

## 2017-01-24 DIAGNOSIS — I119 Hypertensive heart disease without heart failure: Secondary | ICD-10-CM | POA: Diagnosis not present

## 2017-01-24 DIAGNOSIS — F419 Anxiety disorder, unspecified: Secondary | ICD-10-CM | POA: Diagnosis not present

## 2017-01-24 DIAGNOSIS — J209 Acute bronchitis, unspecified: Secondary | ICD-10-CM | POA: Diagnosis not present

## 2017-01-24 DIAGNOSIS — R0602 Shortness of breath: Secondary | ICD-10-CM | POA: Diagnosis not present

## 2017-01-24 DIAGNOSIS — I4891 Unspecified atrial fibrillation: Secondary | ICD-10-CM | POA: Diagnosis not present

## 2017-01-25 DIAGNOSIS — E039 Hypothyroidism, unspecified: Secondary | ICD-10-CM | POA: Diagnosis present

## 2017-01-25 DIAGNOSIS — J209 Acute bronchitis, unspecified: Secondary | ICD-10-CM | POA: Diagnosis present

## 2017-01-25 DIAGNOSIS — R0602 Shortness of breath: Secondary | ICD-10-CM | POA: Diagnosis not present

## 2017-01-25 DIAGNOSIS — J189 Pneumonia, unspecified organism: Secondary | ICD-10-CM | POA: Diagnosis present

## 2017-01-25 DIAGNOSIS — Z79899 Other long term (current) drug therapy: Secondary | ICD-10-CM | POA: Diagnosis not present

## 2017-01-25 DIAGNOSIS — R9431 Abnormal electrocardiogram [ECG] [EKG]: Secondary | ICD-10-CM | POA: Diagnosis not present

## 2017-01-25 DIAGNOSIS — J440 Chronic obstructive pulmonary disease with acute lower respiratory infection: Secondary | ICD-10-CM | POA: Diagnosis present

## 2017-01-25 DIAGNOSIS — I480 Paroxysmal atrial fibrillation: Secondary | ICD-10-CM | POA: Diagnosis not present

## 2017-01-25 DIAGNOSIS — I252 Old myocardial infarction: Secondary | ICD-10-CM | POA: Diagnosis not present

## 2017-01-25 DIAGNOSIS — M1990 Unspecified osteoarthritis, unspecified site: Secondary | ICD-10-CM | POA: Diagnosis present

## 2017-01-25 DIAGNOSIS — R0902 Hypoxemia: Secondary | ICD-10-CM | POA: Diagnosis not present

## 2017-01-25 DIAGNOSIS — I482 Chronic atrial fibrillation: Secondary | ICD-10-CM | POA: Diagnosis present

## 2017-01-25 DIAGNOSIS — E785 Hyperlipidemia, unspecified: Secondary | ICD-10-CM | POA: Diagnosis present

## 2017-01-25 DIAGNOSIS — D72829 Elevated white blood cell count, unspecified: Secondary | ICD-10-CM | POA: Diagnosis present

## 2017-01-25 DIAGNOSIS — E876 Hypokalemia: Secondary | ICD-10-CM | POA: Diagnosis present

## 2017-01-25 DIAGNOSIS — R062 Wheezing: Secondary | ICD-10-CM | POA: Diagnosis not present

## 2017-01-25 DIAGNOSIS — I509 Heart failure, unspecified: Secondary | ICD-10-CM | POA: Diagnosis present

## 2017-01-25 DIAGNOSIS — Z9889 Other specified postprocedural states: Secondary | ICD-10-CM | POA: Diagnosis not present

## 2017-01-25 DIAGNOSIS — F419 Anxiety disorder, unspecified: Secondary | ICD-10-CM | POA: Diagnosis present

## 2017-01-25 DIAGNOSIS — I481 Persistent atrial fibrillation: Secondary | ICD-10-CM | POA: Diagnosis not present

## 2017-01-25 DIAGNOSIS — I340 Nonrheumatic mitral (valve) insufficiency: Secondary | ICD-10-CM | POA: Diagnosis present

## 2017-01-25 DIAGNOSIS — J45901 Unspecified asthma with (acute) exacerbation: Secondary | ICD-10-CM | POA: Diagnosis present

## 2017-01-25 DIAGNOSIS — I2510 Atherosclerotic heart disease of native coronary artery without angina pectoris: Secondary | ICD-10-CM | POA: Diagnosis present

## 2017-01-25 DIAGNOSIS — Z952 Presence of prosthetic heart valve: Secondary | ICD-10-CM | POA: Diagnosis not present

## 2017-01-25 DIAGNOSIS — I110 Hypertensive heart disease with heart failure: Secondary | ICD-10-CM | POA: Diagnosis present

## 2017-01-25 DIAGNOSIS — Z7901 Long term (current) use of anticoagulants: Secondary | ICD-10-CM | POA: Diagnosis not present

## 2017-01-25 DIAGNOSIS — I4892 Unspecified atrial flutter: Secondary | ICD-10-CM | POA: Diagnosis present

## 2017-01-25 DIAGNOSIS — I119 Hypertensive heart disease without heart failure: Secondary | ICD-10-CM | POA: Diagnosis not present

## 2017-01-25 DIAGNOSIS — I4891 Unspecified atrial fibrillation: Secondary | ICD-10-CM | POA: Diagnosis not present

## 2017-02-01 DIAGNOSIS — E784 Other hyperlipidemia: Secondary | ICD-10-CM | POA: Diagnosis not present

## 2017-02-01 DIAGNOSIS — R0602 Shortness of breath: Secondary | ICD-10-CM | POA: Diagnosis not present

## 2017-02-01 DIAGNOSIS — J449 Chronic obstructive pulmonary disease, unspecified: Secondary | ICD-10-CM | POA: Diagnosis not present

## 2017-02-01 DIAGNOSIS — J441 Chronic obstructive pulmonary disease with (acute) exacerbation: Secondary | ICD-10-CM | POA: Diagnosis not present

## 2017-02-01 DIAGNOSIS — I480 Paroxysmal atrial fibrillation: Secondary | ICD-10-CM | POA: Diagnosis not present

## 2017-02-01 DIAGNOSIS — Z9889 Other specified postprocedural states: Secondary | ICD-10-CM | POA: Diagnosis not present

## 2017-02-01 DIAGNOSIS — I10 Essential (primary) hypertension: Secondary | ICD-10-CM | POA: Diagnosis not present

## 2017-02-03 DIAGNOSIS — I4891 Unspecified atrial fibrillation: Secondary | ICD-10-CM | POA: Diagnosis not present

## 2017-02-03 DIAGNOSIS — J209 Acute bronchitis, unspecified: Secondary | ICD-10-CM | POA: Diagnosis not present

## 2017-02-03 DIAGNOSIS — Z09 Encounter for follow-up examination after completed treatment for conditions other than malignant neoplasm: Secondary | ICD-10-CM | POA: Diagnosis not present

## 2017-02-03 DIAGNOSIS — Z6835 Body mass index (BMI) 35.0-35.9, adult: Secondary | ICD-10-CM | POA: Diagnosis not present

## 2017-02-03 DIAGNOSIS — Z7901 Long term (current) use of anticoagulants: Secondary | ICD-10-CM | POA: Diagnosis not present

## 2017-02-10 DIAGNOSIS — Z7901 Long term (current) use of anticoagulants: Secondary | ICD-10-CM | POA: Diagnosis not present

## 2017-02-10 DIAGNOSIS — Z79899 Other long term (current) drug therapy: Secondary | ICD-10-CM | POA: Diagnosis not present

## 2017-02-10 DIAGNOSIS — Z6835 Body mass index (BMI) 35.0-35.9, adult: Secondary | ICD-10-CM | POA: Diagnosis not present

## 2017-02-10 DIAGNOSIS — R300 Dysuria: Secondary | ICD-10-CM | POA: Diagnosis not present

## 2017-02-10 DIAGNOSIS — E669 Obesity, unspecified: Secondary | ICD-10-CM | POA: Diagnosis not present

## 2017-02-24 DIAGNOSIS — Z7901 Long term (current) use of anticoagulants: Secondary | ICD-10-CM | POA: Diagnosis not present

## 2017-02-24 DIAGNOSIS — Z0000 Encounter for general adult medical examination without abnormal findings: Secondary | ICD-10-CM | POA: Diagnosis not present

## 2017-02-24 DIAGNOSIS — Z1389 Encounter for screening for other disorder: Secondary | ICD-10-CM | POA: Diagnosis not present

## 2017-02-24 DIAGNOSIS — Z9181 History of falling: Secondary | ICD-10-CM | POA: Diagnosis not present

## 2017-02-24 DIAGNOSIS — Z6834 Body mass index (BMI) 34.0-34.9, adult: Secondary | ICD-10-CM | POA: Diagnosis not present

## 2017-02-24 DIAGNOSIS — E785 Hyperlipidemia, unspecified: Secondary | ICD-10-CM | POA: Diagnosis not present

## 2017-03-10 DIAGNOSIS — Z7901 Long term (current) use of anticoagulants: Secondary | ICD-10-CM | POA: Diagnosis not present

## 2017-03-17 DIAGNOSIS — R791 Abnormal coagulation profile: Secondary | ICD-10-CM | POA: Diagnosis not present

## 2017-03-25 DIAGNOSIS — Z7901 Long term (current) use of anticoagulants: Secondary | ICD-10-CM | POA: Diagnosis not present

## 2017-03-30 DIAGNOSIS — M542 Cervicalgia: Secondary | ICD-10-CM | POA: Diagnosis not present

## 2017-03-30 DIAGNOSIS — H6983 Other specified disorders of Eustachian tube, bilateral: Secondary | ICD-10-CM | POA: Diagnosis not present

## 2017-04-01 DIAGNOSIS — R791 Abnormal coagulation profile: Secondary | ICD-10-CM | POA: Diagnosis not present

## 2017-04-01 DIAGNOSIS — I10 Essential (primary) hypertension: Secondary | ICD-10-CM | POA: Diagnosis not present

## 2017-04-08 DIAGNOSIS — Z7901 Long term (current) use of anticoagulants: Secondary | ICD-10-CM | POA: Diagnosis not present

## 2017-04-14 DIAGNOSIS — J309 Allergic rhinitis, unspecified: Secondary | ICD-10-CM | POA: Diagnosis not present

## 2017-04-14 DIAGNOSIS — E559 Vitamin D deficiency, unspecified: Secondary | ICD-10-CM | POA: Diagnosis not present

## 2017-04-14 DIAGNOSIS — Z6835 Body mass index (BMI) 35.0-35.9, adult: Secondary | ICD-10-CM | POA: Diagnosis not present

## 2017-04-14 DIAGNOSIS — I10 Essential (primary) hypertension: Secondary | ICD-10-CM | POA: Diagnosis not present

## 2017-04-14 DIAGNOSIS — F419 Anxiety disorder, unspecified: Secondary | ICD-10-CM | POA: Diagnosis not present

## 2017-04-14 DIAGNOSIS — H68009 Unspecified Eustachian salpingitis, unspecified ear: Secondary | ICD-10-CM | POA: Diagnosis not present

## 2017-04-14 DIAGNOSIS — R791 Abnormal coagulation profile: Secondary | ICD-10-CM | POA: Diagnosis not present

## 2017-04-14 DIAGNOSIS — Z79899 Other long term (current) drug therapy: Secondary | ICD-10-CM | POA: Diagnosis not present

## 2017-04-14 DIAGNOSIS — E039 Hypothyroidism, unspecified: Secondary | ICD-10-CM | POA: Diagnosis not present

## 2017-04-14 DIAGNOSIS — E785 Hyperlipidemia, unspecified: Secondary | ICD-10-CM | POA: Diagnosis not present

## 2017-04-20 DIAGNOSIS — E890 Postprocedural hypothyroidism: Secondary | ICD-10-CM | POA: Diagnosis not present

## 2017-04-21 DIAGNOSIS — R791 Abnormal coagulation profile: Secondary | ICD-10-CM | POA: Diagnosis not present

## 2017-05-05 DIAGNOSIS — R791 Abnormal coagulation profile: Secondary | ICD-10-CM | POA: Diagnosis not present

## 2017-05-12 DIAGNOSIS — M5441 Lumbago with sciatica, right side: Secondary | ICD-10-CM | POA: Diagnosis not present

## 2017-05-12 DIAGNOSIS — Z7901 Long term (current) use of anticoagulants: Secondary | ICD-10-CM | POA: Diagnosis not present

## 2017-05-12 DIAGNOSIS — N390 Urinary tract infection, site not specified: Secondary | ICD-10-CM | POA: Diagnosis not present

## 2017-05-12 DIAGNOSIS — M436 Torticollis: Secondary | ICD-10-CM | POA: Diagnosis not present

## 2017-05-12 DIAGNOSIS — M549 Dorsalgia, unspecified: Secondary | ICD-10-CM | POA: Diagnosis not present

## 2017-05-12 DIAGNOSIS — Z6835 Body mass index (BMI) 35.0-35.9, adult: Secondary | ICD-10-CM | POA: Diagnosis not present

## 2017-05-19 DIAGNOSIS — Z9889 Other specified postprocedural states: Secondary | ICD-10-CM | POA: Diagnosis not present

## 2017-05-19 DIAGNOSIS — Z7901 Long term (current) use of anticoagulants: Secondary | ICD-10-CM | POA: Diagnosis not present

## 2017-05-19 DIAGNOSIS — I480 Paroxysmal atrial fibrillation: Secondary | ICD-10-CM | POA: Diagnosis not present

## 2017-05-19 DIAGNOSIS — J40 Bronchitis, not specified as acute or chronic: Secondary | ICD-10-CM | POA: Diagnosis not present

## 2017-05-26 DIAGNOSIS — R791 Abnormal coagulation profile: Secondary | ICD-10-CM | POA: Diagnosis not present

## 2017-06-03 DIAGNOSIS — R791 Abnormal coagulation profile: Secondary | ICD-10-CM | POA: Diagnosis not present

## 2017-06-10 DIAGNOSIS — Z7901 Long term (current) use of anticoagulants: Secondary | ICD-10-CM | POA: Diagnosis not present

## 2017-06-14 DIAGNOSIS — Z825 Family history of asthma and other chronic lower respiratory diseases: Secondary | ICD-10-CM | POA: Diagnosis not present

## 2017-06-14 DIAGNOSIS — K219 Gastro-esophageal reflux disease without esophagitis: Secondary | ICD-10-CM | POA: Diagnosis not present

## 2017-06-14 DIAGNOSIS — Z8679 Personal history of other diseases of the circulatory system: Secondary | ICD-10-CM | POA: Diagnosis not present

## 2017-06-14 DIAGNOSIS — Z8701 Personal history of pneumonia (recurrent): Secondary | ICD-10-CM | POA: Diagnosis not present

## 2017-06-14 DIAGNOSIS — Z9889 Other specified postprocedural states: Secondary | ICD-10-CM | POA: Diagnosis not present

## 2017-06-14 DIAGNOSIS — R0602 Shortness of breath: Secondary | ICD-10-CM | POA: Diagnosis not present

## 2017-06-14 DIAGNOSIS — I4891 Unspecified atrial fibrillation: Secondary | ICD-10-CM | POA: Diagnosis not present

## 2017-06-14 DIAGNOSIS — R062 Wheezing: Secondary | ICD-10-CM | POA: Diagnosis not present

## 2017-06-14 DIAGNOSIS — Z7722 Contact with and (suspected) exposure to environmental tobacco smoke (acute) (chronic): Secondary | ICD-10-CM | POA: Diagnosis not present

## 2017-06-24 DIAGNOSIS — R791 Abnormal coagulation profile: Secondary | ICD-10-CM | POA: Diagnosis not present

## 2017-07-02 DIAGNOSIS — Z7901 Long term (current) use of anticoagulants: Secondary | ICD-10-CM | POA: Diagnosis not present

## 2017-07-08 DIAGNOSIS — N179 Acute kidney failure, unspecified: Secondary | ICD-10-CM | POA: Diagnosis not present

## 2017-07-08 DIAGNOSIS — E559 Vitamin D deficiency, unspecified: Secondary | ICD-10-CM | POA: Diagnosis not present

## 2017-07-09 DIAGNOSIS — R791 Abnormal coagulation profile: Secondary | ICD-10-CM | POA: Diagnosis not present

## 2017-07-13 DIAGNOSIS — N390 Urinary tract infection, site not specified: Secondary | ICD-10-CM | POA: Diagnosis not present

## 2017-07-13 DIAGNOSIS — I10 Essential (primary) hypertension: Secondary | ICD-10-CM | POA: Diagnosis not present

## 2017-07-13 DIAGNOSIS — E559 Vitamin D deficiency, unspecified: Secondary | ICD-10-CM | POA: Diagnosis not present

## 2017-07-13 DIAGNOSIS — N3000 Acute cystitis without hematuria: Secondary | ICD-10-CM | POA: Diagnosis not present

## 2017-07-16 DIAGNOSIS — R791 Abnormal coagulation profile: Secondary | ICD-10-CM | POA: Diagnosis not present

## 2017-08-02 DIAGNOSIS — F419 Anxiety disorder, unspecified: Secondary | ICD-10-CM | POA: Diagnosis not present

## 2017-08-02 DIAGNOSIS — I4891 Unspecified atrial fibrillation: Secondary | ICD-10-CM | POA: Diagnosis not present

## 2017-08-02 DIAGNOSIS — E039 Hypothyroidism, unspecified: Secondary | ICD-10-CM | POA: Diagnosis not present

## 2017-08-02 DIAGNOSIS — E559 Vitamin D deficiency, unspecified: Secondary | ICD-10-CM | POA: Diagnosis not present

## 2017-08-02 DIAGNOSIS — I2510 Atherosclerotic heart disease of native coronary artery without angina pectoris: Secondary | ICD-10-CM | POA: Diagnosis not present

## 2017-08-02 DIAGNOSIS — R791 Abnormal coagulation profile: Secondary | ICD-10-CM | POA: Diagnosis not present

## 2017-08-02 DIAGNOSIS — E669 Obesity, unspecified: Secondary | ICD-10-CM | POA: Diagnosis not present

## 2017-08-02 DIAGNOSIS — I10 Essential (primary) hypertension: Secondary | ICD-10-CM | POA: Diagnosis not present

## 2017-08-02 DIAGNOSIS — I509 Heart failure, unspecified: Secondary | ICD-10-CM | POA: Diagnosis not present

## 2017-08-02 DIAGNOSIS — Z9889 Other specified postprocedural states: Secondary | ICD-10-CM | POA: Diagnosis not present

## 2017-08-02 DIAGNOSIS — Z79899 Other long term (current) drug therapy: Secondary | ICD-10-CM | POA: Diagnosis not present

## 2017-08-02 DIAGNOSIS — Z6835 Body mass index (BMI) 35.0-35.9, adult: Secondary | ICD-10-CM | POA: Diagnosis not present

## 2017-08-17 DIAGNOSIS — R791 Abnormal coagulation profile: Secondary | ICD-10-CM | POA: Diagnosis not present

## 2017-08-18 DIAGNOSIS — Z9889 Other specified postprocedural states: Secondary | ICD-10-CM | POA: Diagnosis not present

## 2017-08-18 DIAGNOSIS — I340 Nonrheumatic mitral (valve) insufficiency: Secondary | ICD-10-CM | POA: Diagnosis not present

## 2017-08-18 DIAGNOSIS — I10 Essential (primary) hypertension: Secondary | ICD-10-CM | POA: Diagnosis not present

## 2017-08-18 DIAGNOSIS — K219 Gastro-esophageal reflux disease without esophagitis: Secondary | ICD-10-CM | POA: Diagnosis not present

## 2017-08-18 DIAGNOSIS — I4891 Unspecified atrial fibrillation: Secondary | ICD-10-CM | POA: Diagnosis not present

## 2017-08-23 DIAGNOSIS — Z6834 Body mass index (BMI) 34.0-34.9, adult: Secondary | ICD-10-CM | POA: Diagnosis not present

## 2017-08-23 DIAGNOSIS — N760 Acute vaginitis: Secondary | ICD-10-CM | POA: Diagnosis not present

## 2017-08-23 DIAGNOSIS — M19011 Primary osteoarthritis, right shoulder: Secondary | ICD-10-CM | POA: Diagnosis not present

## 2017-08-23 DIAGNOSIS — I4891 Unspecified atrial fibrillation: Secondary | ICD-10-CM | POA: Diagnosis not present

## 2017-08-23 DIAGNOSIS — E669 Obesity, unspecified: Secondary | ICD-10-CM | POA: Diagnosis not present

## 2017-08-23 DIAGNOSIS — R791 Abnormal coagulation profile: Secondary | ICD-10-CM | POA: Diagnosis not present

## 2017-08-23 DIAGNOSIS — N390 Urinary tract infection, site not specified: Secondary | ICD-10-CM | POA: Diagnosis not present

## 2017-09-01 DIAGNOSIS — R791 Abnormal coagulation profile: Secondary | ICD-10-CM | POA: Diagnosis not present

## 2017-09-08 DIAGNOSIS — E890 Postprocedural hypothyroidism: Secondary | ICD-10-CM | POA: Diagnosis not present

## 2017-09-08 DIAGNOSIS — I340 Nonrheumatic mitral (valve) insufficiency: Secondary | ICD-10-CM | POA: Diagnosis not present

## 2017-09-08 DIAGNOSIS — I4891 Unspecified atrial fibrillation: Secondary | ICD-10-CM | POA: Diagnosis not present

## 2017-09-08 DIAGNOSIS — R002 Palpitations: Secondary | ICD-10-CM | POA: Diagnosis not present

## 2017-09-08 DIAGNOSIS — I10 Essential (primary) hypertension: Secondary | ICD-10-CM | POA: Diagnosis not present

## 2017-09-08 DIAGNOSIS — R791 Abnormal coagulation profile: Secondary | ICD-10-CM | POA: Diagnosis not present

## 2017-09-08 DIAGNOSIS — Z7901 Long term (current) use of anticoagulants: Secondary | ICD-10-CM | POA: Diagnosis not present

## 2017-09-08 DIAGNOSIS — Z9889 Other specified postprocedural states: Secondary | ICD-10-CM | POA: Diagnosis not present

## 2017-09-23 DIAGNOSIS — R791 Abnormal coagulation profile: Secondary | ICD-10-CM | POA: Diagnosis not present

## 2017-09-30 DIAGNOSIS — R791 Abnormal coagulation profile: Secondary | ICD-10-CM | POA: Diagnosis not present

## 2017-10-08 DIAGNOSIS — R791 Abnormal coagulation profile: Secondary | ICD-10-CM | POA: Diagnosis not present

## 2017-10-14 DIAGNOSIS — R300 Dysuria: Secondary | ICD-10-CM | POA: Diagnosis not present

## 2017-10-14 DIAGNOSIS — Z6835 Body mass index (BMI) 35.0-35.9, adult: Secondary | ICD-10-CM | POA: Diagnosis not present

## 2017-10-14 DIAGNOSIS — N952 Postmenopausal atrophic vaginitis: Secondary | ICD-10-CM | POA: Diagnosis not present

## 2017-10-14 DIAGNOSIS — R791 Abnormal coagulation profile: Secondary | ICD-10-CM | POA: Diagnosis not present

## 2017-10-14 DIAGNOSIS — Z79899 Other long term (current) drug therapy: Secondary | ICD-10-CM | POA: Diagnosis not present

## 2017-10-14 DIAGNOSIS — E669 Obesity, unspecified: Secondary | ICD-10-CM | POA: Diagnosis not present

## 2017-10-14 DIAGNOSIS — E039 Hypothyroidism, unspecified: Secondary | ICD-10-CM | POA: Diagnosis not present

## 2017-10-14 DIAGNOSIS — B373 Candidiasis of vulva and vagina: Secondary | ICD-10-CM | POA: Diagnosis not present

## 2017-10-25 DIAGNOSIS — R791 Abnormal coagulation profile: Secondary | ICD-10-CM | POA: Diagnosis not present

## 2017-10-28 DIAGNOSIS — I10 Essential (primary) hypertension: Secondary | ICD-10-CM | POA: Diagnosis not present

## 2017-10-28 DIAGNOSIS — R062 Wheezing: Secondary | ICD-10-CM | POA: Diagnosis not present

## 2017-10-28 DIAGNOSIS — I340 Nonrheumatic mitral (valve) insufficiency: Secondary | ICD-10-CM | POA: Diagnosis not present

## 2017-10-28 DIAGNOSIS — I480 Paroxysmal atrial fibrillation: Secondary | ICD-10-CM | POA: Diagnosis not present

## 2017-10-28 DIAGNOSIS — Z9889 Other specified postprocedural states: Secondary | ICD-10-CM | POA: Diagnosis not present

## 2017-11-03 DIAGNOSIS — E890 Postprocedural hypothyroidism: Secondary | ICD-10-CM | POA: Diagnosis not present

## 2017-11-08 DIAGNOSIS — E039 Hypothyroidism, unspecified: Secondary | ICD-10-CM | POA: Diagnosis not present

## 2017-11-08 DIAGNOSIS — F329 Major depressive disorder, single episode, unspecified: Secondary | ICD-10-CM | POA: Diagnosis not present

## 2017-11-08 DIAGNOSIS — I4891 Unspecified atrial fibrillation: Secondary | ICD-10-CM | POA: Diagnosis not present

## 2017-11-08 DIAGNOSIS — Z79899 Other long term (current) drug therapy: Secondary | ICD-10-CM | POA: Diagnosis not present

## 2017-11-08 DIAGNOSIS — R921 Mammographic calcification found on diagnostic imaging of breast: Secondary | ICD-10-CM | POA: Diagnosis not present

## 2017-11-08 DIAGNOSIS — E669 Obesity, unspecified: Secondary | ICD-10-CM | POA: Diagnosis not present

## 2017-11-08 DIAGNOSIS — M159 Polyosteoarthritis, unspecified: Secondary | ICD-10-CM | POA: Diagnosis not present

## 2017-11-08 DIAGNOSIS — Z23 Encounter for immunization: Secondary | ICD-10-CM | POA: Diagnosis not present

## 2017-11-08 DIAGNOSIS — R791 Abnormal coagulation profile: Secondary | ICD-10-CM | POA: Diagnosis not present

## 2017-11-08 DIAGNOSIS — F419 Anxiety disorder, unspecified: Secondary | ICD-10-CM | POA: Diagnosis not present

## 2017-11-08 DIAGNOSIS — Z9181 History of falling: Secondary | ICD-10-CM | POA: Diagnosis not present

## 2017-11-08 DIAGNOSIS — Z7901 Long term (current) use of anticoagulants: Secondary | ICD-10-CM | POA: Diagnosis not present

## 2017-11-08 DIAGNOSIS — Z6835 Body mass index (BMI) 35.0-35.9, adult: Secondary | ICD-10-CM | POA: Diagnosis not present

## 2017-11-08 DIAGNOSIS — E785 Hyperlipidemia, unspecified: Secondary | ICD-10-CM | POA: Diagnosis not present

## 2017-11-08 DIAGNOSIS — Z6828 Body mass index (BMI) 28.0-28.9, adult: Secondary | ICD-10-CM | POA: Diagnosis not present

## 2017-11-08 DIAGNOSIS — D72829 Elevated white blood cell count, unspecified: Secondary | ICD-10-CM | POA: Diagnosis not present

## 2017-11-08 DIAGNOSIS — K219 Gastro-esophageal reflux disease without esophagitis: Secondary | ICD-10-CM | POA: Diagnosis not present

## 2017-11-08 DIAGNOSIS — I10 Essential (primary) hypertension: Secondary | ICD-10-CM | POA: Diagnosis not present

## 2017-11-15 DIAGNOSIS — R791 Abnormal coagulation profile: Secondary | ICD-10-CM | POA: Diagnosis not present

## 2017-11-26 DIAGNOSIS — Z7901 Long term (current) use of anticoagulants: Secondary | ICD-10-CM | POA: Diagnosis not present

## 2017-12-09 DIAGNOSIS — R791 Abnormal coagulation profile: Secondary | ICD-10-CM | POA: Diagnosis not present

## 2017-12-13 DIAGNOSIS — E890 Postprocedural hypothyroidism: Secondary | ICD-10-CM | POA: Diagnosis not present

## 2017-12-23 DIAGNOSIS — Z7901 Long term (current) use of anticoagulants: Secondary | ICD-10-CM | POA: Diagnosis not present

## 2018-01-06 DIAGNOSIS — Z7901 Long term (current) use of anticoagulants: Secondary | ICD-10-CM | POA: Diagnosis not present

## 2018-01-20 DIAGNOSIS — R791 Abnormal coagulation profile: Secondary | ICD-10-CM | POA: Diagnosis not present

## 2018-01-27 DIAGNOSIS — R791 Abnormal coagulation profile: Secondary | ICD-10-CM | POA: Diagnosis not present

## 2018-02-02 DIAGNOSIS — E890 Postprocedural hypothyroidism: Secondary | ICD-10-CM | POA: Diagnosis not present

## 2018-02-02 DIAGNOSIS — I10 Essential (primary) hypertension: Secondary | ICD-10-CM | POA: Diagnosis not present

## 2018-02-02 DIAGNOSIS — Z9889 Other specified postprocedural states: Secondary | ICD-10-CM | POA: Diagnosis not present

## 2018-02-02 DIAGNOSIS — I340 Nonrheumatic mitral (valve) insufficiency: Secondary | ICD-10-CM | POA: Diagnosis not present

## 2018-02-02 DIAGNOSIS — I480 Paroxysmal atrial fibrillation: Secondary | ICD-10-CM | POA: Diagnosis not present

## 2018-02-02 DIAGNOSIS — I4891 Unspecified atrial fibrillation: Secondary | ICD-10-CM | POA: Diagnosis not present

## 2018-02-03 DIAGNOSIS — Z7901 Long term (current) use of anticoagulants: Secondary | ICD-10-CM | POA: Diagnosis not present

## 2018-02-10 DIAGNOSIS — R350 Frequency of micturition: Secondary | ICD-10-CM | POA: Diagnosis not present

## 2018-02-10 DIAGNOSIS — R791 Abnormal coagulation profile: Secondary | ICD-10-CM | POA: Diagnosis not present

## 2018-02-10 DIAGNOSIS — I4891 Unspecified atrial fibrillation: Secondary | ICD-10-CM | POA: Diagnosis not present

## 2018-02-10 DIAGNOSIS — N390 Urinary tract infection, site not specified: Secondary | ICD-10-CM | POA: Diagnosis not present

## 2018-02-15 DIAGNOSIS — I252 Old myocardial infarction: Secondary | ICD-10-CM | POA: Diagnosis not present

## 2018-02-15 DIAGNOSIS — I517 Cardiomegaly: Secondary | ICD-10-CM | POA: Diagnosis not present

## 2018-02-21 DIAGNOSIS — I480 Paroxysmal atrial fibrillation: Secondary | ICD-10-CM | POA: Diagnosis not present

## 2018-02-25 DIAGNOSIS — Z7901 Long term (current) use of anticoagulants: Secondary | ICD-10-CM | POA: Diagnosis not present

## 2018-02-25 DIAGNOSIS — Z9181 History of falling: Secondary | ICD-10-CM | POA: Diagnosis not present

## 2018-02-25 DIAGNOSIS — Z1231 Encounter for screening mammogram for malignant neoplasm of breast: Secondary | ICD-10-CM | POA: Diagnosis not present

## 2018-02-25 DIAGNOSIS — Z1331 Encounter for screening for depression: Secondary | ICD-10-CM | POA: Diagnosis not present

## 2018-02-25 DIAGNOSIS — E785 Hyperlipidemia, unspecified: Secondary | ICD-10-CM | POA: Diagnosis not present

## 2018-02-25 DIAGNOSIS — Z0000 Encounter for general adult medical examination without abnormal findings: Secondary | ICD-10-CM | POA: Diagnosis not present

## 2018-02-25 DIAGNOSIS — N959 Unspecified menopausal and perimenopausal disorder: Secondary | ICD-10-CM | POA: Diagnosis not present

## 2018-02-25 DIAGNOSIS — Z6835 Body mass index (BMI) 35.0-35.9, adult: Secondary | ICD-10-CM | POA: Diagnosis not present

## 2018-03-07 DIAGNOSIS — E559 Vitamin D deficiency, unspecified: Secondary | ICD-10-CM | POA: Diagnosis not present

## 2018-03-07 DIAGNOSIS — E039 Hypothyroidism, unspecified: Secondary | ICD-10-CM | POA: Diagnosis not present

## 2018-03-07 DIAGNOSIS — R791 Abnormal coagulation profile: Secondary | ICD-10-CM | POA: Diagnosis not present

## 2018-03-07 DIAGNOSIS — Z79899 Other long term (current) drug therapy: Secondary | ICD-10-CM | POA: Diagnosis not present

## 2018-03-07 DIAGNOSIS — I10 Essential (primary) hypertension: Secondary | ICD-10-CM | POA: Diagnosis not present

## 2018-03-07 DIAGNOSIS — E669 Obesity, unspecified: Secondary | ICD-10-CM | POA: Diagnosis not present

## 2018-03-07 DIAGNOSIS — E785 Hyperlipidemia, unspecified: Secondary | ICD-10-CM | POA: Diagnosis not present

## 2018-03-07 DIAGNOSIS — I4891 Unspecified atrial fibrillation: Secondary | ICD-10-CM | POA: Diagnosis not present

## 2018-03-07 DIAGNOSIS — Z6834 Body mass index (BMI) 34.0-34.9, adult: Secondary | ICD-10-CM | POA: Diagnosis not present

## 2018-03-09 DIAGNOSIS — I340 Nonrheumatic mitral (valve) insufficiency: Secondary | ICD-10-CM | POA: Diagnosis not present

## 2018-03-09 DIAGNOSIS — E890 Postprocedural hypothyroidism: Secondary | ICD-10-CM | POA: Diagnosis not present

## 2018-03-09 DIAGNOSIS — Z9889 Other specified postprocedural states: Secondary | ICD-10-CM | POA: Diagnosis not present

## 2018-03-09 DIAGNOSIS — I4891 Unspecified atrial fibrillation: Secondary | ICD-10-CM | POA: Diagnosis not present

## 2018-03-09 DIAGNOSIS — I480 Paroxysmal atrial fibrillation: Secondary | ICD-10-CM | POA: Diagnosis not present

## 2018-03-09 DIAGNOSIS — R062 Wheezing: Secondary | ICD-10-CM | POA: Diagnosis not present

## 2018-03-09 DIAGNOSIS — I10 Essential (primary) hypertension: Secondary | ICD-10-CM | POA: Diagnosis not present

## 2018-03-21 DIAGNOSIS — R791 Abnormal coagulation profile: Secondary | ICD-10-CM | POA: Diagnosis not present

## 2018-04-05 DIAGNOSIS — R791 Abnormal coagulation profile: Secondary | ICD-10-CM | POA: Diagnosis not present

## 2018-04-16 DIAGNOSIS — J9601 Acute respiratory failure with hypoxia: Secondary | ICD-10-CM | POA: Diagnosis not present

## 2018-04-16 DIAGNOSIS — I480 Paroxysmal atrial fibrillation: Secondary | ICD-10-CM | POA: Diagnosis not present

## 2018-04-16 DIAGNOSIS — Z952 Presence of prosthetic heart valve: Secondary | ICD-10-CM | POA: Diagnosis not present

## 2018-04-16 DIAGNOSIS — I340 Nonrheumatic mitral (valve) insufficiency: Secondary | ICD-10-CM | POA: Diagnosis not present

## 2018-04-16 DIAGNOSIS — E039 Hypothyroidism, unspecified: Secondary | ICD-10-CM | POA: Diagnosis not present

## 2018-04-16 DIAGNOSIS — I5033 Acute on chronic diastolic (congestive) heart failure: Secondary | ICD-10-CM | POA: Diagnosis not present

## 2018-04-16 DIAGNOSIS — Z6830 Body mass index (BMI) 30.0-30.9, adult: Secondary | ICD-10-CM | POA: Diagnosis not present

## 2018-04-16 DIAGNOSIS — I4892 Unspecified atrial flutter: Secondary | ICD-10-CM | POA: Diagnosis not present

## 2018-04-16 DIAGNOSIS — I110 Hypertensive heart disease with heart failure: Secondary | ICD-10-CM | POA: Diagnosis not present

## 2018-04-16 DIAGNOSIS — J811 Chronic pulmonary edema: Secondary | ICD-10-CM | POA: Diagnosis not present

## 2018-04-16 DIAGNOSIS — Z79899 Other long term (current) drug therapy: Secondary | ICD-10-CM | POA: Diagnosis not present

## 2018-04-16 DIAGNOSIS — Z885 Allergy status to narcotic agent status: Secondary | ICD-10-CM | POA: Diagnosis not present

## 2018-04-16 DIAGNOSIS — I2510 Atherosclerotic heart disease of native coronary artery without angina pectoris: Secondary | ICD-10-CM | POA: Diagnosis not present

## 2018-04-16 DIAGNOSIS — R0602 Shortness of breath: Secondary | ICD-10-CM | POA: Diagnosis not present

## 2018-04-16 DIAGNOSIS — I509 Heart failure, unspecified: Secondary | ICD-10-CM | POA: Diagnosis not present

## 2018-04-16 DIAGNOSIS — E669 Obesity, unspecified: Secondary | ICD-10-CM | POA: Diagnosis not present

## 2018-04-16 DIAGNOSIS — I5031 Acute diastolic (congestive) heart failure: Secondary | ICD-10-CM | POA: Diagnosis not present

## 2018-04-16 DIAGNOSIS — Z7901 Long term (current) use of anticoagulants: Secondary | ICD-10-CM | POA: Diagnosis not present

## 2018-04-16 DIAGNOSIS — J90 Pleural effusion, not elsewhere classified: Secondary | ICD-10-CM | POA: Diagnosis not present

## 2018-04-16 DIAGNOSIS — J449 Chronic obstructive pulmonary disease, unspecified: Secondary | ICD-10-CM | POA: Diagnosis not present

## 2018-04-16 DIAGNOSIS — R600 Localized edema: Secondary | ICD-10-CM | POA: Diagnosis not present

## 2018-04-17 DIAGNOSIS — I5031 Acute diastolic (congestive) heart failure: Secondary | ICD-10-CM | POA: Diagnosis not present

## 2018-04-17 DIAGNOSIS — E669 Obesity, unspecified: Secondary | ICD-10-CM | POA: Diagnosis not present

## 2018-04-17 DIAGNOSIS — J811 Chronic pulmonary edema: Secondary | ICD-10-CM | POA: Diagnosis not present

## 2018-04-17 DIAGNOSIS — E039 Hypothyroidism, unspecified: Secondary | ICD-10-CM | POA: Diagnosis not present

## 2018-04-17 DIAGNOSIS — R600 Localized edema: Secondary | ICD-10-CM | POA: Diagnosis not present

## 2018-04-17 DIAGNOSIS — I4892 Unspecified atrial flutter: Secondary | ICD-10-CM | POA: Diagnosis not present

## 2018-04-17 DIAGNOSIS — R0602 Shortness of breath: Secondary | ICD-10-CM | POA: Diagnosis not present

## 2018-04-17 DIAGNOSIS — I110 Hypertensive heart disease with heart failure: Secondary | ICD-10-CM | POA: Diagnosis not present

## 2018-04-17 DIAGNOSIS — I480 Paroxysmal atrial fibrillation: Secondary | ICD-10-CM | POA: Diagnosis not present

## 2018-04-17 DIAGNOSIS — J90 Pleural effusion, not elsewhere classified: Secondary | ICD-10-CM | POA: Diagnosis not present

## 2018-04-17 DIAGNOSIS — J9601 Acute respiratory failure with hypoxia: Secondary | ICD-10-CM | POA: Diagnosis not present

## 2018-04-17 DIAGNOSIS — Z885 Allergy status to narcotic agent status: Secondary | ICD-10-CM | POA: Diagnosis not present

## 2018-04-17 DIAGNOSIS — Z952 Presence of prosthetic heart valve: Secondary | ICD-10-CM | POA: Diagnosis not present

## 2018-04-17 DIAGNOSIS — I340 Nonrheumatic mitral (valve) insufficiency: Secondary | ICD-10-CM | POA: Diagnosis not present

## 2018-04-17 DIAGNOSIS — R0609 Other forms of dyspnea: Secondary | ICD-10-CM | POA: Diagnosis not present

## 2018-04-17 DIAGNOSIS — I10 Essential (primary) hypertension: Secondary | ICD-10-CM | POA: Diagnosis not present

## 2018-04-17 DIAGNOSIS — I509 Heart failure, unspecified: Secondary | ICD-10-CM | POA: Diagnosis not present

## 2018-04-17 DIAGNOSIS — I2510 Atherosclerotic heart disease of native coronary artery without angina pectoris: Secondary | ICD-10-CM | POA: Diagnosis not present

## 2018-04-19 DIAGNOSIS — R791 Abnormal coagulation profile: Secondary | ICD-10-CM | POA: Diagnosis not present

## 2018-04-19 DIAGNOSIS — E669 Obesity, unspecified: Secondary | ICD-10-CM | POA: Diagnosis not present

## 2018-04-19 DIAGNOSIS — Z79899 Other long term (current) drug therapy: Secondary | ICD-10-CM | POA: Diagnosis not present

## 2018-04-19 DIAGNOSIS — I4891 Unspecified atrial fibrillation: Secondary | ICD-10-CM | POA: Diagnosis not present

## 2018-04-19 DIAGNOSIS — J309 Allergic rhinitis, unspecified: Secondary | ICD-10-CM | POA: Diagnosis not present

## 2018-04-19 DIAGNOSIS — Z1339 Encounter for screening examination for other mental health and behavioral disorders: Secondary | ICD-10-CM | POA: Diagnosis not present

## 2018-04-19 DIAGNOSIS — Z6833 Body mass index (BMI) 33.0-33.9, adult: Secondary | ICD-10-CM | POA: Diagnosis not present

## 2018-04-22 DIAGNOSIS — E890 Postprocedural hypothyroidism: Secondary | ICD-10-CM | POA: Diagnosis not present

## 2018-04-27 DIAGNOSIS — Z6833 Body mass index (BMI) 33.0-33.9, adult: Secondary | ICD-10-CM | POA: Diagnosis not present

## 2018-04-27 DIAGNOSIS — R791 Abnormal coagulation profile: Secondary | ICD-10-CM | POA: Diagnosis not present

## 2018-04-27 DIAGNOSIS — I10 Essential (primary) hypertension: Secondary | ICD-10-CM | POA: Diagnosis not present

## 2018-04-27 DIAGNOSIS — I4891 Unspecified atrial fibrillation: Secondary | ICD-10-CM | POA: Diagnosis not present

## 2018-05-04 DIAGNOSIS — R791 Abnormal coagulation profile: Secondary | ICD-10-CM | POA: Diagnosis not present

## 2018-05-13 DIAGNOSIS — R791 Abnormal coagulation profile: Secondary | ICD-10-CM | POA: Diagnosis not present

## 2018-05-24 DIAGNOSIS — R791 Abnormal coagulation profile: Secondary | ICD-10-CM | POA: Diagnosis not present

## 2018-05-24 DIAGNOSIS — R233 Spontaneous ecchymoses: Secondary | ICD-10-CM | POA: Diagnosis not present

## 2018-05-24 DIAGNOSIS — D485 Neoplasm of uncertain behavior of skin: Secondary | ICD-10-CM | POA: Diagnosis not present

## 2018-05-27 DIAGNOSIS — Z9989 Dependence on other enabling machines and devices: Secondary | ICD-10-CM | POA: Diagnosis not present

## 2018-05-27 DIAGNOSIS — Z9981 Dependence on supplemental oxygen: Secondary | ICD-10-CM | POA: Diagnosis not present

## 2018-05-27 DIAGNOSIS — I444 Left anterior fascicular block: Secondary | ICD-10-CM | POA: Diagnosis not present

## 2018-05-27 DIAGNOSIS — I110 Hypertensive heart disease with heart failure: Secondary | ICD-10-CM | POA: Diagnosis present

## 2018-05-27 DIAGNOSIS — I080 Rheumatic disorders of both mitral and aortic valves: Secondary | ICD-10-CM | POA: Diagnosis not present

## 2018-05-27 DIAGNOSIS — Z515 Encounter for palliative care: Secondary | ICD-10-CM | POA: Diagnosis not present

## 2018-05-27 DIAGNOSIS — I351 Nonrheumatic aortic (valve) insufficiency: Secondary | ICD-10-CM | POA: Diagnosis not present

## 2018-05-27 DIAGNOSIS — Z7901 Long term (current) use of anticoagulants: Secondary | ICD-10-CM | POA: Diagnosis not present

## 2018-05-27 DIAGNOSIS — R062 Wheezing: Secondary | ICD-10-CM | POA: Diagnosis not present

## 2018-05-27 DIAGNOSIS — R002 Palpitations: Secondary | ICD-10-CM | POA: Diagnosis not present

## 2018-05-27 DIAGNOSIS — Z6834 Body mass index (BMI) 34.0-34.9, adult: Secondary | ICD-10-CM | POA: Diagnosis not present

## 2018-05-27 DIAGNOSIS — L03113 Cellulitis of right upper limb: Secondary | ICD-10-CM | POA: Diagnosis not present

## 2018-05-27 DIAGNOSIS — F419 Anxiety disorder, unspecified: Secondary | ICD-10-CM | POA: Diagnosis present

## 2018-05-27 DIAGNOSIS — I480 Paroxysmal atrial fibrillation: Secondary | ICD-10-CM | POA: Diagnosis not present

## 2018-05-27 DIAGNOSIS — J208 Acute bronchitis due to other specified organisms: Secondary | ICD-10-CM | POA: Diagnosis present

## 2018-05-27 DIAGNOSIS — E785 Hyperlipidemia, unspecified: Secondary | ICD-10-CM | POA: Diagnosis present

## 2018-05-27 DIAGNOSIS — Z90710 Acquired absence of both cervix and uterus: Secondary | ICD-10-CM | POA: Diagnosis not present

## 2018-05-27 DIAGNOSIS — J9811 Atelectasis: Secondary | ICD-10-CM | POA: Diagnosis not present

## 2018-05-27 DIAGNOSIS — E7800 Pure hypercholesterolemia, unspecified: Secondary | ICD-10-CM | POA: Diagnosis present

## 2018-05-27 DIAGNOSIS — J9601 Acute respiratory failure with hypoxia: Secondary | ICD-10-CM | POA: Diagnosis not present

## 2018-05-27 DIAGNOSIS — Z8249 Family history of ischemic heart disease and other diseases of the circulatory system: Secondary | ICD-10-CM | POA: Diagnosis not present

## 2018-05-27 DIAGNOSIS — I517 Cardiomegaly: Secondary | ICD-10-CM | POA: Diagnosis not present

## 2018-05-27 DIAGNOSIS — I471 Supraventricular tachycardia: Secondary | ICD-10-CM | POA: Diagnosis present

## 2018-05-27 DIAGNOSIS — J811 Chronic pulmonary edema: Secondary | ICD-10-CM | POA: Diagnosis not present

## 2018-05-27 DIAGNOSIS — I4891 Unspecified atrial fibrillation: Secondary | ICD-10-CM | POA: Diagnosis not present

## 2018-05-27 DIAGNOSIS — J984 Other disorders of lung: Secondary | ICD-10-CM | POA: Diagnosis not present

## 2018-05-27 DIAGNOSIS — Z6835 Body mass index (BMI) 35.0-35.9, adult: Secondary | ICD-10-CM | POA: Diagnosis not present

## 2018-05-27 DIAGNOSIS — I459 Conduction disorder, unspecified: Secondary | ICD-10-CM | POA: Diagnosis not present

## 2018-05-27 DIAGNOSIS — Z79899 Other long term (current) drug therapy: Secondary | ICD-10-CM | POA: Diagnosis not present

## 2018-05-27 DIAGNOSIS — R1310 Dysphagia, unspecified: Secondary | ICD-10-CM | POA: Diagnosis not present

## 2018-05-27 DIAGNOSIS — Z96652 Presence of left artificial knee joint: Secondary | ICD-10-CM | POA: Diagnosis present

## 2018-05-27 DIAGNOSIS — R609 Edema, unspecified: Secondary | ICD-10-CM | POA: Diagnosis not present

## 2018-05-27 DIAGNOSIS — I481 Persistent atrial fibrillation: Secondary | ICD-10-CM | POA: Diagnosis present

## 2018-05-27 DIAGNOSIS — I493 Ventricular premature depolarization: Secondary | ICD-10-CM | POA: Diagnosis not present

## 2018-05-27 DIAGNOSIS — Z9119 Patient's noncompliance with other medical treatment and regimen: Secondary | ICD-10-CM | POA: Diagnosis not present

## 2018-05-27 DIAGNOSIS — J9600 Acute respiratory failure, unspecified whether with hypoxia or hypercapnia: Secondary | ICD-10-CM | POA: Diagnosis not present

## 2018-05-27 DIAGNOSIS — R000 Tachycardia, unspecified: Secondary | ICD-10-CM | POA: Diagnosis not present

## 2018-05-27 DIAGNOSIS — I2510 Atherosclerotic heart disease of native coronary artery without angina pectoris: Secondary | ICD-10-CM | POA: Diagnosis present

## 2018-05-27 DIAGNOSIS — I4892 Unspecified atrial flutter: Secondary | ICD-10-CM | POA: Diagnosis not present

## 2018-05-27 DIAGNOSIS — Z66 Do not resuscitate: Secondary | ICD-10-CM | POA: Diagnosis present

## 2018-05-27 DIAGNOSIS — Y998 Other external cause status: Secondary | ICD-10-CM | POA: Diagnosis not present

## 2018-05-27 DIAGNOSIS — R918 Other nonspecific abnormal finding of lung field: Secondary | ICD-10-CM | POA: Diagnosis not present

## 2018-05-27 DIAGNOSIS — Z87828 Personal history of other (healed) physical injury and trauma: Secondary | ICD-10-CM | POA: Diagnosis not present

## 2018-05-27 DIAGNOSIS — S61451A Open bite of right hand, initial encounter: Secondary | ICD-10-CM | POA: Diagnosis not present

## 2018-05-27 DIAGNOSIS — Z888 Allergy status to other drugs, medicaments and biological substances status: Secondary | ICD-10-CM | POA: Diagnosis not present

## 2018-05-27 DIAGNOSIS — Z8701 Personal history of pneumonia (recurrent): Secondary | ICD-10-CM | POA: Diagnosis not present

## 2018-05-27 DIAGNOSIS — T148XXA Other injury of unspecified body region, initial encounter: Secondary | ICD-10-CM | POA: Diagnosis not present

## 2018-05-27 DIAGNOSIS — R9431 Abnormal electrocardiogram [ECG] [EKG]: Secondary | ICD-10-CM | POA: Diagnosis not present

## 2018-05-27 DIAGNOSIS — I340 Nonrheumatic mitral (valve) insufficiency: Secondary | ICD-10-CM | POA: Diagnosis not present

## 2018-05-27 DIAGNOSIS — W540XXA Bitten by dog, initial encounter: Secondary | ICD-10-CM | POA: Diagnosis not present

## 2018-05-27 DIAGNOSIS — E039 Hypothyroidism, unspecified: Secondary | ICD-10-CM | POA: Diagnosis present

## 2018-05-27 DIAGNOSIS — Z9889 Other specified postprocedural states: Secondary | ICD-10-CM | POA: Diagnosis not present

## 2018-05-27 DIAGNOSIS — R0602 Shortness of breath: Secondary | ICD-10-CM | POA: Diagnosis not present

## 2018-05-27 DIAGNOSIS — R0600 Dyspnea, unspecified: Secondary | ICD-10-CM | POA: Diagnosis not present

## 2018-05-27 DIAGNOSIS — Z885 Allergy status to narcotic agent status: Secondary | ICD-10-CM | POA: Diagnosis not present

## 2018-05-27 DIAGNOSIS — I5023 Acute on chronic systolic (congestive) heart failure: Secondary | ICD-10-CM | POA: Diagnosis not present

## 2018-05-27 DIAGNOSIS — J9602 Acute respiratory failure with hypercapnia: Secondary | ICD-10-CM | POA: Diagnosis not present

## 2018-05-27 DIAGNOSIS — J069 Acute upper respiratory infection, unspecified: Secondary | ICD-10-CM | POA: Diagnosis not present

## 2018-05-27 DIAGNOSIS — I10 Essential (primary) hypertension: Secondary | ICD-10-CM | POA: Diagnosis not present

## 2018-05-27 DIAGNOSIS — Z8349 Family history of other endocrine, nutritional and metabolic diseases: Secondary | ICD-10-CM | POA: Diagnosis not present

## 2018-05-27 DIAGNOSIS — J40 Bronchitis, not specified as acute or chronic: Secondary | ICD-10-CM | POA: Diagnosis not present

## 2018-05-27 DIAGNOSIS — J209 Acute bronchitis, unspecified: Secondary | ICD-10-CM | POA: Diagnosis not present

## 2018-05-27 DIAGNOSIS — Z952 Presence of prosthetic heart valve: Secondary | ICD-10-CM | POA: Diagnosis not present

## 2018-06-21 DIAGNOSIS — Z79899 Other long term (current) drug therapy: Secondary | ICD-10-CM | POA: Diagnosis not present

## 2018-06-21 DIAGNOSIS — E785 Hyperlipidemia, unspecified: Secondary | ICD-10-CM | POA: Diagnosis not present

## 2018-06-21 DIAGNOSIS — R791 Abnormal coagulation profile: Secondary | ICD-10-CM | POA: Diagnosis not present

## 2018-06-21 DIAGNOSIS — E039 Hypothyroidism, unspecified: Secondary | ICD-10-CM | POA: Diagnosis not present

## 2018-06-21 DIAGNOSIS — E669 Obesity, unspecified: Secondary | ICD-10-CM | POA: Diagnosis not present

## 2018-06-21 DIAGNOSIS — Z6833 Body mass index (BMI) 33.0-33.9, adult: Secondary | ICD-10-CM | POA: Diagnosis not present

## 2018-06-21 DIAGNOSIS — Z09 Encounter for follow-up examination after completed treatment for conditions other than malignant neoplasm: Secondary | ICD-10-CM | POA: Diagnosis not present

## 2018-06-28 DIAGNOSIS — E890 Postprocedural hypothyroidism: Secondary | ICD-10-CM | POA: Diagnosis not present

## 2018-07-05 DIAGNOSIS — R791 Abnormal coagulation profile: Secondary | ICD-10-CM | POA: Diagnosis not present

## 2018-07-12 DIAGNOSIS — R791 Abnormal coagulation profile: Secondary | ICD-10-CM | POA: Diagnosis not present

## 2018-07-13 DIAGNOSIS — I340 Nonrheumatic mitral (valve) insufficiency: Secondary | ICD-10-CM | POA: Diagnosis not present

## 2018-07-13 DIAGNOSIS — R0602 Shortness of breath: Secondary | ICD-10-CM | POA: Diagnosis not present

## 2018-07-13 DIAGNOSIS — I480 Paroxysmal atrial fibrillation: Secondary | ICD-10-CM | POA: Diagnosis not present

## 2018-07-13 DIAGNOSIS — E890 Postprocedural hypothyroidism: Secondary | ICD-10-CM | POA: Diagnosis not present

## 2018-07-13 DIAGNOSIS — Z9889 Other specified postprocedural states: Secondary | ICD-10-CM | POA: Diagnosis not present

## 2018-07-13 DIAGNOSIS — E785 Hyperlipidemia, unspecified: Secondary | ICD-10-CM | POA: Diagnosis not present

## 2018-07-19 DIAGNOSIS — R791 Abnormal coagulation profile: Secondary | ICD-10-CM | POA: Diagnosis not present

## 2018-07-20 DIAGNOSIS — R0602 Shortness of breath: Secondary | ICD-10-CM | POA: Diagnosis not present

## 2018-07-20 DIAGNOSIS — I499 Cardiac arrhythmia, unspecified: Secondary | ICD-10-CM | POA: Diagnosis not present

## 2018-07-20 DIAGNOSIS — I4891 Unspecified atrial fibrillation: Secondary | ICD-10-CM | POA: Diagnosis not present

## 2018-07-21 DIAGNOSIS — I252 Old myocardial infarction: Secondary | ICD-10-CM | POA: Diagnosis not present

## 2018-07-21 DIAGNOSIS — I4891 Unspecified atrial fibrillation: Secondary | ICD-10-CM | POA: Diagnosis not present

## 2018-07-21 DIAGNOSIS — I444 Left anterior fascicular block: Secondary | ICD-10-CM | POA: Diagnosis not present

## 2018-07-26 DIAGNOSIS — I4891 Unspecified atrial fibrillation: Secondary | ICD-10-CM | POA: Diagnosis not present

## 2018-07-26 DIAGNOSIS — R791 Abnormal coagulation profile: Secondary | ICD-10-CM | POA: Diagnosis not present

## 2018-08-21 DEATH — deceased
